# Patient Record
Sex: Male | Born: 1970 | Race: White | Hispanic: No | Marital: Single | Smoking: Never smoker
Health system: Southern US, Community
[De-identification: ages and names within clinical notes are randomized; demographics above are authoritative.]

## PROBLEM LIST (undated history)

## (undated) DIAGNOSIS — Z8249 Family history of ischemic heart disease and other diseases of the circulatory system: Secondary | ICD-10-CM

## (undated) DIAGNOSIS — Z8042 Family history of malignant neoplasm of prostate: Secondary | ICD-10-CM

## (undated) DIAGNOSIS — J869 Pyothorax without fistula: Secondary | ICD-10-CM

## (undated) DIAGNOSIS — E785 Hyperlipidemia, unspecified: Secondary | ICD-10-CM

## (undated) DIAGNOSIS — E119 Type 2 diabetes mellitus without complications: Secondary | ICD-10-CM

## (undated) HISTORY — PX: OTHER SURGICAL HISTORY: SHX169

## (undated) HISTORY — DX: Family history of malignant neoplasm of prostate: Z80.42

## (undated) HISTORY — DX: Family history of ischemic heart disease and other diseases of the circulatory system: Z82.49

## (undated) HISTORY — DX: Pyothorax without fistula: J86.9

## (undated) HISTORY — DX: Hyperlipidemia, unspecified: E78.5

---

## 2008-02-18 ENCOUNTER — Ambulatory Visit: Payer: Self-pay | Admitting: Family Medicine

## 2008-03-06 ENCOUNTER — Ambulatory Visit: Payer: Self-pay | Admitting: Family Medicine

## 2008-03-13 ENCOUNTER — Ambulatory Visit: Payer: Self-pay | Admitting: Family Medicine

## 2008-03-27 ENCOUNTER — Ambulatory Visit: Payer: Self-pay | Admitting: Family Medicine

## 2008-04-24 ENCOUNTER — Ambulatory Visit: Payer: Self-pay | Admitting: Family Medicine

## 2014-02-12 DIAGNOSIS — J869 Pyothorax without fistula: Secondary | ICD-10-CM

## 2014-02-12 HISTORY — DX: Pyothorax without fistula: J86.9

## 2014-03-08 ENCOUNTER — Other Ambulatory Visit: Payer: Self-pay | Admitting: Medical

## 2014-03-08 ENCOUNTER — Ambulatory Visit (INDEPENDENT_AMBULATORY_CARE_PROVIDER_SITE_OTHER): Payer: 59 | Admitting: Medical

## 2014-03-08 ENCOUNTER — Ambulatory Visit
Admission: RE | Admit: 2014-03-08 | Discharge: 2014-03-08 | Disposition: A | Discharge status: Home / Self Care | Payer: 59 | Source: Ambulatory Visit | Attending: Medical | Admitting: Medical

## 2014-03-08 ENCOUNTER — Encounter: Payer: Self-pay | Admitting: Medical

## 2014-03-08 ENCOUNTER — Other Ambulatory Visit: Payer: Self-pay | Admitting: Family Medicine

## 2014-03-08 VITALS — BP 142/80 | HR 100 | Temp 98.0°F | Resp 16 | Ht 65.0 in | Wt 234.0 lb

## 2014-03-08 DIAGNOSIS — R000 Tachycardia, unspecified: Secondary | ICD-10-CM

## 2014-03-08 DIAGNOSIS — R05 Cough: Secondary | ICD-10-CM

## 2014-03-08 DIAGNOSIS — R202 Paresthesia of skin: Secondary | ICD-10-CM

## 2014-03-08 DIAGNOSIS — M549 Dorsalgia, unspecified: Secondary | ICD-10-CM

## 2014-03-08 DIAGNOSIS — G5620 Lesion of ulnar nerve, unspecified upper limb: Secondary | ICD-10-CM

## 2014-03-08 DIAGNOSIS — M62830 Muscle spasm of back: Secondary | ICD-10-CM

## 2014-03-08 DIAGNOSIS — M5380 Other specified dorsopathies, site unspecified: Secondary | ICD-10-CM

## 2014-03-08 DIAGNOSIS — R059 Cough, unspecified: Secondary | ICD-10-CM

## 2014-03-08 DIAGNOSIS — R0602 Shortness of breath: Secondary | ICD-10-CM

## 2014-03-08 DIAGNOSIS — R209 Unspecified disturbances of skin sensation: Secondary | ICD-10-CM

## 2014-03-08 LAB — BASIC METABOLIC PANEL
BUN: 11 mg/dL (ref 6–23)
CO2: 19 mEq/L (ref 19–32)
Calcium: 9.6 mg/dL (ref 8.4–10.5)
Chloride: 100 mEq/L (ref 96–112)
Creat: 0.68 mg/dL (ref 0.50–1.35)
GLUCOSE: 288 mg/dL — AB (ref 70–99)
Potassium: 3.9 mEq/L (ref 3.5–5.3)
Sodium: 136 mEq/L (ref 135–145)

## 2014-03-08 LAB — CBC
HCT: 43.1 % (ref 39.0–52.0)
Hemoglobin: 14.8 g/dL (ref 13.0–17.0)
MCH: 29.4 pg (ref 26.0–34.0)
MCHC: 34.3 g/dL (ref 30.0–36.0)
MCV: 85.7 fL (ref 78.0–100.0)
Platelets: 329 10*3/uL (ref 150–400)
RBC: 5.03 MIL/uL (ref 4.22–5.81)
RDW: 13 % (ref 11.5–15.5)
WBC: 20.2 10*3/uL — ABNORMAL HIGH (ref 4.0–10.5)

## 2014-03-08 LAB — D-DIMER, QUANTITATIVE (NOT AT ARMC): D DIMER QUANT: 0.37 ug{FEU}/mL (ref 0.00–0.48)

## 2014-03-08 MED ORDER — LEVOFLOXACIN 500 MG PO TABS: 500.0000 mg | ORAL_TABLET | Freq: Every day | ORAL | Status: DC

## 2014-03-08 NOTE — Addendum Note (Signed)
Addended by: Armanda Magic on: 03/08/2014 12:04 PM   Modules accepted: Orders

## 2014-03-08 NOTE — Progress Notes (Signed)
Subjective:   Dean Wallace is a 43 y.o. male presenting on 03/08/2014 with Cough and Back Pain  New returning patient.  Last visit 2009 here.   He notes cough x 2 weeks, though it was seasonal allergie, started back Claritin daily.  Feels like mucous needs to come up that won't .  2 days ago awoke with back pain, collapsed on the floor with severe back spasm, mid to upper back when he tried to get out of bed.  Laid on the floor 1/2 hour, then finally was able to get up in a chair.  After an hour still had back soreness, but was mobile at that time.  At the same time the back pain/spasm started, felt numbness of right 4th and 5th finger, left 5th finger.  The numbness has continued to now.   Still having back pain, hurts worse with lying, better with sitting.  No prior similar, but has general back pain in the past.  Denies having any back problems the night he went to bed.  Denies any recent strenuous activity, fall, injury, or trauma.    Since the back spasm, has had trouble getting good breaths.  Denies DOE, just can't seem to get good breath.   No sick contacts.  Using ibuprofen.  Nonsmoker.  No recent long travel, surgery, procedure, no hx/o DVT/PE, no calve pain.  He does use arm rest on chairs regularly.  No other aggravating or relieving factors.  No other complaint.  Review of Systems Constitutional: +subjective fever, -chills, -sweats, -unexpected weight change,+fatigue, +generally weak ENT: -runny nose, -ear pain, -sore throat Cardiology:  -chest pain, -palpitations, -edema, no hemoptysis Respiratory: +wheezing Gastroenterology: + mild abdominal pain with cough, -nausea, -vomiting, -diarrhea, -constipation  Hematology: -bleeding or bruising problems Musculoskeletal: -arthralgias, -myalgias, -joint swelling Ophthalmology: -vision changes Urology: -dysuria, -difficulty urinating, -hematuria, -urinary frequency, -urgency Neurology: -headache         Objective:    Filed  Vitals:   03/08/14 0912  BP: 142/80  Pulse: 100  Temp: 98 F (36.7 C)  Resp: 16    General appearance: alert, no distress, WD/WN, obese white male, coughing at times, at times slightly dyspneic HEENT: normocephalic, sclerae anicteric, TMs pearly, nares patent, no discharge or erythema, pharynx normal Oral cavity: somewhat dry, no lesions Neck: supple, no lymphadenopathy, no thyromegaly, no masses, no JVD Heart: RRR, normal S1, S2, no murmurs Lungs: right lower fields with some dullness to percussion, reduced breath sounds right lower fields, otherwise CTA bilaterally, no wheezes, rhonchi, or rales Abdomen: +bs, soft, non tender, non distended, no masses, no hepatomegaly, no splenomegaly Back: nontender, normal ROM MSK: UE  Nontender, no deformity, normal ROM Pulses: 2+ symmetric, upper and lower extremities, normal cap refill Ext: no edema, -homans,no calve tenderness or swelling Neuro: normal finger and hand sensation, strength      Assessment: Encounter Diagnoses  Name Primary?  . Cough Yes  . Upper back pain   . Back spasm   . Paresthesias   . Ulnar nerve compression   . Tachycardia      Plan: Somewhat unusual presentation, but given abnormal lung fields, severe back pain onset, and collection of symptoms, labs and xrays today to further eval.  We will call back today with next steps.    Dean Wallace was seen today for cough and back pain.  Diagnoses and associated orders for this visit:  Cough - D-dimer, quantitative - CBC - Basic metabolic panel - DG Chest 2 View; Future - DG  Thoracic Spine 2 View; Future  Upper back pain - D-dimer, quantitative - CBC - Basic metabolic panel - DG Chest 2 View; Future - DG Thoracic Spine 2 View; Future  Back spasm - D-dimer, quantitative - CBC - Basic metabolic panel - DG Chest 2 View; Future - DG Thoracic Spine 2 View; Future  Paresthesias - D-dimer, quantitative - CBC - Basic metabolic panel - DG Chest 2 View;  Future - DG Thoracic Spine 2 View; Future  Ulnar nerve compression - D-dimer, quantitative - CBC - Basic metabolic panel - DG Chest 2 View; Future - DG Thoracic Spine 2 View; Future  Tachycardia - D-dimer, quantitative - CBC - Basic metabolic panel - DG Chest 2 View; Future - DG Thoracic Spine 2 View; Future     Return pending studies.

## 2014-03-09 ENCOUNTER — Other Ambulatory Visit: Payer: Self-pay | Admitting: Medical

## 2014-03-09 ENCOUNTER — Ambulatory Visit (HOSPITAL_COMMUNITY)
Admission: RE | Admit: 2014-03-09 | Discharge: 2014-03-09 | Disposition: A | Discharge status: Home / Self Care | Payer: 59 | Source: Ambulatory Visit | Attending: Radiology | Admitting: Radiology

## 2014-03-09 ENCOUNTER — Telehealth: Payer: Self-pay

## 2014-03-09 ENCOUNTER — Ambulatory Visit (HOSPITAL_COMMUNITY)
Admission: RE | Admit: 2014-03-09 | Discharge: 2014-03-09 | Disposition: A | Discharge status: Home / Self Care | Payer: 59 | Source: Ambulatory Visit | Attending: Medical | Admitting: Medical

## 2014-03-09 VITALS — BP 136/88

## 2014-03-09 DIAGNOSIS — J90 Pleural effusion, not elsewhere classified: Secondary | ICD-10-CM | POA: Insufficient documentation

## 2014-03-09 DIAGNOSIS — M549 Dorsalgia, unspecified: Secondary | ICD-10-CM

## 2014-03-09 DIAGNOSIS — D72829 Elevated white blood cell count, unspecified: Secondary | ICD-10-CM

## 2014-03-09 DIAGNOSIS — R05 Cough: Secondary | ICD-10-CM

## 2014-03-09 DIAGNOSIS — R059 Cough, unspecified: Secondary | ICD-10-CM | POA: Insufficient documentation

## 2014-03-09 DIAGNOSIS — J189 Pneumonia, unspecified organism: Secondary | ICD-10-CM | POA: Insufficient documentation

## 2014-03-09 LAB — BODY FLUID CELL COUNT WITH DIFFERENTIAL
Lymphs, Fluid: 15 %
MONOCYTE-MACROPHAGE-SEROUS FLUID: 13 % — AB (ref 50–90)
Neutrophil Count, Fluid: 75 % — ABNORMAL HIGH (ref 0–25)
Total Nucleated Cell Count, Fluid: UNDETERMINED cu mm (ref 0–1000)

## 2014-03-09 NOTE — Procedures (Signed)
US guided diagnostic right thoracentesis performed yielding 140 cc's turbid, yellow fluid. The fluid was sent to the lab for preordered studies. The collection was multiloculated and only the above amount of fluid could be removed at this time. F/u CXR pending. Consider f/u CT chest for further evaluation.

## 2014-03-09 NOTE — Telephone Encounter (Signed)
Message copied by Louie Bun on Thu Mar 09, 2014 10:19 AM ------      Message from: Carlena Hurl      Created: Thu Mar 09, 2014  9:57 AM       Please call and let him know that I think we need to get a sample of that fluid this in his lungs.  This is called an aspiration and it is done with ultrasound guidance and a needle.   See if he can go today if we can work this out if not we will schedule for tomorrow.            Please call central scheduling at [phone].  Let them know that this guy (outpatient) has a pleural effusion, we would like it aspirated and sent for testing.  See what their protocol is, do we need to order this in Epic, do we need to order the labs or do they have a protocol?             ------

## 2014-03-10 ENCOUNTER — Encounter: Payer: Self-pay | Admitting: Medical

## 2014-03-10 ENCOUNTER — Encounter (HOSPITAL_COMMUNITY): Payer: Self-pay | Admitting: Cardiology

## 2014-03-10 ENCOUNTER — Ambulatory Visit (INDEPENDENT_AMBULATORY_CARE_PROVIDER_SITE_OTHER): Payer: 59 | Admitting: Medical

## 2014-03-10 ENCOUNTER — Ambulatory Visit (HOSPITAL_COMMUNITY)
Admission: RE | Admit: 2014-03-10 | Discharge: 2014-03-10 | Disposition: A | Discharge status: Home / Self Care | Payer: 59 | Source: Ambulatory Visit | Attending: Medical | Admitting: Medical

## 2014-03-10 ENCOUNTER — Inpatient Hospital Stay (HOSPITAL_COMMUNITY)
Admission: AD | Admit: 2014-03-10 | Discharge: 2014-03-17 | DRG: 853 | Disposition: A | Discharge status: Home / Self Care | Payer: 59 | Source: Ambulatory Visit | Attending: Internal Medicine | Admitting: Internal Medicine

## 2014-03-10 ENCOUNTER — Telehealth: Payer: Self-pay | Admitting: Family Medicine

## 2014-03-10 VITALS — BP 130/80 | HR 126 | Temp 98.4°F | Resp 22 | Wt 221.0 lb

## 2014-03-10 DIAGNOSIS — R0602 Shortness of breath: Secondary | ICD-10-CM

## 2014-03-10 DIAGNOSIS — I498 Other specified cardiac arrhythmias: Secondary | ICD-10-CM | POA: Diagnosis present

## 2014-03-10 DIAGNOSIS — I10 Essential (primary) hypertension: Secondary | ICD-10-CM | POA: Diagnosis present

## 2014-03-10 DIAGNOSIS — D72829 Elevated white blood cell count, unspecified: Secondary | ICD-10-CM

## 2014-03-10 DIAGNOSIS — R05 Cough: Secondary | ICD-10-CM

## 2014-03-10 DIAGNOSIS — J9819 Other pulmonary collapse: Secondary | ICD-10-CM | POA: Diagnosis not present

## 2014-03-10 DIAGNOSIS — J869 Pyothorax without fistula: Secondary | ICD-10-CM | POA: Diagnosis present

## 2014-03-10 DIAGNOSIS — E1165 Type 2 diabetes mellitus with hyperglycemia: Secondary | ICD-10-CM

## 2014-03-10 DIAGNOSIS — R0902 Hypoxemia: Secondary | ICD-10-CM

## 2014-03-10 DIAGNOSIS — IMO0001 Reserved for inherently not codable concepts without codable children: Secondary | ICD-10-CM | POA: Diagnosis present

## 2014-03-10 DIAGNOSIS — R9389 Abnormal findings on diagnostic imaging of other specified body structures: Secondary | ICD-10-CM

## 2014-03-10 DIAGNOSIS — J189 Pneumonia, unspecified organism: Secondary | ICD-10-CM | POA: Diagnosis present

## 2014-03-10 DIAGNOSIS — A419 Sepsis, unspecified organism: Secondary | ICD-10-CM

## 2014-03-10 DIAGNOSIS — D62 Acute posthemorrhagic anemia: Secondary | ICD-10-CM | POA: Diagnosis not present

## 2014-03-10 DIAGNOSIS — E119 Type 2 diabetes mellitus without complications: Secondary | ICD-10-CM

## 2014-03-10 DIAGNOSIS — E876 Hypokalemia: Secondary | ICD-10-CM | POA: Diagnosis not present

## 2014-03-10 DIAGNOSIS — M549 Dorsalgia, unspecified: Secondary | ICD-10-CM

## 2014-03-10 DIAGNOSIS — Z794 Long term (current) use of insulin: Secondary | ICD-10-CM

## 2014-03-10 DIAGNOSIS — R059 Cough, unspecified: Secondary | ICD-10-CM

## 2014-03-10 DIAGNOSIS — Z8249 Family history of ischemic heart disease and other diseases of the circulatory system: Secondary | ICD-10-CM

## 2014-03-10 DIAGNOSIS — J90 Pleural effusion, not elsewhere classified: Secondary | ICD-10-CM

## 2014-03-10 HISTORY — DX: Type 2 diabetes mellitus without complications: E11.9

## 2014-03-10 LAB — CBC WITH DIFFERENTIAL/PLATELET
Basophils Absolute: 0 10*3/uL (ref 0.0–0.1)
Basophils Relative: 0 % (ref 0–1)
EOS PCT: 0 % (ref 0–5)
Eosinophils Absolute: 0 10*3/uL (ref 0.0–0.7)
HEMATOCRIT: 40.6 % (ref 39.0–52.0)
HEMOGLOBIN: 14.4 g/dL (ref 13.0–17.0)
LYMPHS ABS: 2.4 10*3/uL (ref 0.7–4.0)
Lymphocytes Relative: 12 % (ref 12–46)
MCH: 30.5 pg (ref 26.0–34.0)
MCHC: 35.5 g/dL (ref 30.0–36.0)
MCV: 86 fL (ref 78.0–100.0)
MONOS PCT: 4 % (ref 3–12)
Monocytes Absolute: 0.9 10*3/uL (ref 0.1–1.0)
Neutro Abs: 16.7 10*3/uL — ABNORMAL HIGH (ref 1.7–7.7)
Neutrophils Relative %: 83 % — ABNORMAL HIGH (ref 43–77)
Platelets: 336 10*3/uL (ref 150–400)
RBC: 4.72 MIL/uL (ref 4.22–5.81)
RDW: 12.7 % (ref 11.5–15.5)
WBC: 20 10*3/uL — AB (ref 4.0–10.5)

## 2014-03-10 LAB — COMPREHENSIVE METABOLIC PANEL
ALT: 22 U/L (ref 0–53)
AST: 13 U/L (ref 0–37)
Albumin: 2.5 g/dL — ABNORMAL LOW (ref 3.5–5.2)
Alkaline Phosphatase: 81 U/L (ref 39–117)
BILIRUBIN TOTAL: 0.5 mg/dL (ref 0.3–1.2)
BUN: 9 mg/dL (ref 6–23)
CALCIUM: 9.2 mg/dL (ref 8.4–10.5)
CHLORIDE: 96 meq/L (ref 96–112)
CO2: 14 meq/L — AB (ref 19–32)
Creatinine, Ser: 0.59 mg/dL (ref 0.50–1.35)
GFR calc non Af Amer: >90 mL/min (ref >90)
GLUCOSE: 322 mg/dL — AB (ref 70–99)
Potassium: 3.9 mEq/L (ref 3.7–5.3)
Sodium: 132 mEq/L — ABNORMAL LOW (ref 137–147)
Total Protein: 6.9 g/dL (ref 6.0–8.3)

## 2014-03-10 LAB — PH, BODY FLUID: PH, FLUID: 6.5

## 2014-03-10 LAB — GLUCOSE, SEROUS FLUID: Glucose, Fluid: 103 mg/dL

## 2014-03-10 LAB — PROTEIN, BODY FLUID: Total protein, fluid: 4.9 g/dL

## 2014-03-10 LAB — PROTIME-INR
INR: 1.19 (ref 0.00–1.49)
Prothrombin Time: 14.8 seconds (ref 11.6–15.2)

## 2014-03-10 LAB — LACTATE DEHYDROGENASE, PLEURAL OR PERITONEAL FLUID: LD, Fluid: 2080 U/L — ABNORMAL HIGH (ref 3–23)

## 2014-03-10 LAB — APTT: aPTT: 35 seconds (ref 24–37)

## 2014-03-10 MED ORDER — ENOXAPARIN SODIUM 40 MG/0.4ML ~~LOC~~ SOLN
40.0000 mg | SUBCUTANEOUS | Status: DC
  Administered 2014-03-11 (×2): 40 mg via SUBCUTANEOUS
  Filled 2014-03-10 (×3): qty 0.4

## 2014-03-10 MED ORDER — VANCOMYCIN HCL IN DEXTROSE 1-5 GM/200ML-% IV SOLN
1000.0000 mg | Freq: Three times a day (TID) | INTRAVENOUS | Status: DC
  Administered 2014-03-10 – 2014-03-13 (×8): 1000 mg via INTRAVENOUS
  Filled 2014-03-10 (×11): qty 200

## 2014-03-10 MED ORDER — ONDANSETRON HCL 4 MG PO TABS: 4.0000 mg | ORAL_TABLET | Freq: Four times a day (QID) | ORAL | Status: DC | PRN

## 2014-03-10 MED ORDER — IOHEXOL 350 MG/ML SOLN
80.0000 mL | Freq: Once | INTRAVENOUS | Status: AC | PRN
Start: 2014-03-10 — End: 2014-03-10
  Administered 2014-03-10: 80 mL via INTRAVENOUS

## 2014-03-10 MED ORDER — PIPERACILLIN-TAZOBACTAM 3.375 G IVPB
3.3750 g | Freq: Three times a day (TID) | INTRAVENOUS | Status: DC
  Administered 2014-03-10 – 2014-03-16 (×17): 3.375 g via INTRAVENOUS
  Filled 2014-03-10 (×22): qty 50

## 2014-03-10 MED ORDER — SODIUM CHLORIDE 0.9 % IV SOLN
INTRAVENOUS | Status: DC
  Administered 2014-03-10: 19:00:00 via INTRAVENOUS

## 2014-03-10 MED ORDER — MORPHINE SULFATE 2 MG/ML IJ SOLN
2.0000 mg | INTRAMUSCULAR | Status: DC | PRN
  Administered 2014-03-10 – 2014-03-12 (×6): 2 mg via INTRAVENOUS
  Filled 2014-03-10 (×6): qty 1

## 2014-03-10 MED ORDER — ONDANSETRON HCL 4 MG/2ML IJ SOLN: 4.0000 mg | Freq: Four times a day (QID) | INTRAMUSCULAR | Status: DC | PRN

## 2014-03-10 NOTE — Telephone Encounter (Signed)
CHART OPEN BY MISTAKE

## 2014-03-10 NOTE — H&P (Signed)
PCP:   Crisoforo Oxford, PA-C   Chief Complaint:  Cough  HPI: 43 year old male with a history of diet-controlled diabetes mellitus who was seen at the primary care office on 3/25 for cough which has been going for 2 weeks, did not improve with Claritin. Patient on that the complained of severe back muscle spasm, mid  to upper back when he tried to get out of bed. Patient was sent for imaging studies including d-dimer, chest x-ray, thoracic spine x-ray. Chest x-ray showed collapse/consolidation of the right lower lobe and right middle lobe most commonly associated with multifocal pneumonia, CT chest was recommended, patient was called for thoracentesis under ultrasound guidance, and underwent the procedure on 3/26, pleural fluid analysis showed exudative effusion. Patient underwent CT chest on 3/27 to rule out pulmonary embolism, which showed large loculated right pleural effusion worrisome for empyema with compressive atelectasis in the right lower lobe. CT surgery was consulted by the primary care physician, and they recommended VATS versus chest tube insertion. Patient denies fever, no masses, no nausea vomiting or diarrhea. No dysuria urgency or frequency of urination. Patient has history of diabetes mellitus which is diet-controlled, and as per patient the blood sugar has been not well managed with diet only. Patient sent to hospital for admission.   Allergies:  No Known Allergies   No past medical history on file.  No past surgical history on file.  Prior to Admission medications   Medication Sig Start Date End Date Taking? Authorizing Provider  guaiFENesin (MUCINEX) 600 MG 12 hr tablet Take 1,200 mg by mouth 2 (two) times daily as needed for cough.   Yes Historical Provider, MD  ibuprofen (ADVIL,MOTRIN) 200 MG tablet Take 600 mg by mouth every 8 (eight) hours as needed (back pain).   Yes Historical Provider, MD  levofloxacin (LEVAQUIN) 500 MG tablet Take 500 mg by mouth daily. For 7  days 03/08/14  Yes Historical Provider, MD    Social History:  reports that he has never smoked. He does not have any smokeless tobacco history on file. His alcohol and drug histories are not on file.    All the positives are listed in BOLD  Review of Systems:  HEENT: Headache, blurred vision, runny nose, sore throat Neck: Hypothyroidism, hyperthyroidism,,lymphadenopathy Chest : Shortness of breath, history of COPD, Asthma, cough Heart : Chest pain, history of coronary arterey disease GI:  Nausea, vomiting, diarrhea, constipation, GERD GU: Dysuria, urgency, frequency of urination, hematuria Neuro: Stroke, seizures, syncope Psych: Depression, anxiety, hallucinations   Physical Exam: Blood pressure 155/93, pulse 132, temperature 98.2 F (36.8 C), temperature source Oral, height 5' 5"  (1.651 m), weight 99.4 kg (219 lb 2.2 oz), SpO2 96.00%. Constitutional:   Patient is a well-developed and well-nourished *male in no acute distress and cooperative with exam. Head: Normocephalic and atraumatic Mouth: Mucus membranes moist Eyes: PERRL, EOMI, conjunctivae normal Neck: Supple, No Thyromegaly Cardiovascular: RRR, S1 normal, S2 normal Pulmonary/Chest: Bronchial breathing in the right lower lobe Abdominal: Soft. Non-tender, non-distended, bowel sounds are normal, no masses, organomegaly, or guarding present.  Neurological: A&O x3, Strenght is normal and symmetric bilaterally, cranial nerve II-XII are grossly intact, no focal motor deficit, sensory intact to light touch bilaterally.  Extremities : No Cyanosis, Clubbing or Edema   Labs on Admission:  Results for orders placed during the hospital encounter of 03/09/14 (from the past 48 hour(s))  PROTEIN, BODY FLUID     Status: None   Collection Time    03/09/14  2:24 PM  Result Value Ref Range   Total protein, fluid 4.9     Comment: NO NORMAL RANGE ESTABLISHED FOR THIS TEST     Performed at [facility]   Fluid Type-FTP  PLEURAL    PH, BODY FLUID     Status: None   Collection Time    03/09/14  2:24 PM      Result Value Ref Range   pH, Fluid Type PLEURAL     pH, Fluid 6.50     Comment: Performed at [place], BODY FLUID     Status: Abnormal   Collection Time    03/09/14  2:24 PM      Result Value Ref Range   LD, Fluid 2080 (*) 3 - 23 U/L   Comment: RESULT CONFIRMED BY AUTOMATED DILUTION     Performed at [facility]   Fluid Type-FLDH PLEURAL    GLUCOSE, SEROUS FLUID     Status: None   Collection Time    03/09/14  2:24 PM      Result Value Ref Range   Glucose, Fluid 103     Comment:            FLUID GLUCOSE LEVELS OF <60     mg/dL OR VALUES OF 40 mg/dL     LESS THAN A SIMULTANEOUS     SERUM LEVEL ARE CONSIDERED     DECREASED.     Performed at [facility]   Fluid Type-FGLU PLEURAL    BODY FLUID CULTURE     Status: None   Collection Time    03/09/14  2:24 PM      Result Value Ref Range   Specimen Description PLEURAL     Special Requests NONE     Gram Stain       Value: FEW WBC PRESENT,BOTH PMN AND MONONUCLEAR     NO ORGANISMS SEEN     Performed at [facility]   Culture       Value: NO GROWTH 1 DAY     Performed at [place] PENDING    BODY FLUID CELL COUNT WITH DIFFERENTIAL     Status: Abnormal   Collection Time    03/09/14  2:24 PM      Result Value Ref Range   Fluid Type-FCT PLEURAL     Color, Fluid YELLOW  YELLOW   Appearance, Fluid TURBID (*) CLEAR   WBC, Fluid UNABLE TO PERFORM COUNT DUE TO CLOT IN SPECIMEN  0 - 1000 cu mm   Neutrophil Count, Fluid 75 (*) 0 - 25 %   Lymphs, Fluid 15     Monocyte-Macrophage-Serous Fluid 13 (*) 50 - 90 %   Other Cells, Fluid INTRACELLULAR AND EXTRACELLULAR BACTERIA     Comment: CORRELATE WITH CYTOLOGY MICROBIOLOGY    Radiological Exams on Admission: Dg Chest 1 View  03/09/2014   CLINICAL DATA:  Status post right-sided thoracentesis  EXAM: CHEST - 1 VIEW   COMPARISON:  PA and lateral chest x-ray dated March 08, 2014  FINDINGS: There remains volume loss on the right little changed from yesterday's study. There is no evidence of a pneumothorax. The left lung is adequately inflated and clear. The cardiac silhouette where visualized appears mildly enlarged. The pulmonary vascularity on the left is normal. The is obscured on the right.  IMPRESSION: There is no evidence of a postprocedure pneumothorax following the right thoracentesis. The degree of volume loss on the right  is little changed since yesterday's study.   Electronically Signed   By: David  Martinique   On: 03/09/2014 14:24   Ct Angio Chest Pe W/cm &/or Wo Cm  03/10/2014   CLINICAL DATA:  Short of breath and cough.  Recent pneumonia  EXAM: CT ANGIOGRAPHY CHEST WITH CONTRAST  TECHNIQUE: Multidetector CT imaging of the chest was performed using the standard protocol during bolus administration of intravenous contrast. Multiplanar CT image reconstructions and MIPs were obtained to evaluate the vascular anatomy.  CONTRAST:  56m OMNIPAQUE IOHEXOL 350 MG/ML SOLN  COMPARISON:  CXR 03/09/2014  FINDINGS: Negative for pulmonary embolism.  Negative for aortic dissection or aneurysm. Heart size is normal. Mild coronary artery calcification noted. Negative for pericardial effusion.  Large loculated right pleural effusion, suspicious for empyema. Compressive atelectasis in the right lower lobe.  Negative for infiltrate or effusion in the left chest.  Fatty infiltration of the liver with no focal liver lesion identified. Spleen is normal in size.  Review of the MIP images confirms the above findings.  IMPRESSION: Negative for pulmonary embolism.  Large loculated right pleural effusion, worrisome for empyema. There is compressive atelectasis in the right lower lobe.  Fatty liver   Electronically Signed   By: CFranchot GalloM.D.   On: 03/10/2014 12:38   UKoreaThoracentesis Asp Pleural Space W/img Guide  03/09/2014   CLINICAL  DATA:  Pneumonia, cough, right pleural effusion. Request is made for diagnostic/therapeutic right thoracentesis  EXAM: ULTRASOUND GUIDED DIAGNOSTIC RIGHT THORACENTESIS  COMPARISON:  None.  PROCEDURE: An ultrasound guided thoracentesis was thoroughly discussed with the patient and questions answered. The benefits, risks, alternatives and complications were also discussed. The patient understands and wishes to proceed with the procedure. Written consent was obtained.  Ultrasound was performed to localize and mark an adequate pocket of fluid in the right chest. The area was then prepped and draped in the normal sterile fashion. 1% Lidocaine was used for local anesthesia. Under ultrasound guidance a 19 gauge Yueh catheter was introduced. Thoracentesis was performed. The catheter was removed and a dressing applied.  Complications:  None.  FINDINGS: A total of approximately 140 cc's of turbid, yellow fluid was removed. The fluid sample wassent for laboratory analysis. The pleural fluid collection was multiloculated in nature. Only the above amount of fluid could be removed at this time.  IMPRESSION: Successful ultrasound guided diagnostic right thoracentesis yielding 140 cc's of pleural fluid. Pleural fluid collection appears multiloculated on ultrasound imaging. Recommend follow-up CT chest for further evaluation.  Read by: KRowe Robert,P.A.-C.   Electronically Signed   By: TDaryll BrodM.D.   On: 03/09/2014 14:43    Assessment/Plan Active Problems:   Empyema of right pleural space   DM (diabetes mellitus)  Empyema of the right pleural space Called for consult for CT surgery, Dr HRoxan Hockeyfor possible chest tube versus VATS To start him on antibiotics vancomycin and Zosyn. Follow the cultures from the pleural fluid  Diabetes mellitus We'll start the patient on sliding-scale insulin, obtain hemoglobin A1c We'll start Cardura for blood and keep him n.p.o. after midnight.  DVT  prophylaxis Lovenox  Code status: Full code  Family discussion: Discussed with patient's family at bedside   Time Spent on Admission: 570minutes  [facility] Pager: [phone]/27/2015, 6:13 PM  If 7PM-7AM, please contact night-coverage  [email or web address]  Password [email or web address]

## 2014-03-10 NOTE — Progress Notes (Signed)
Subjective:   Dean Wallace is a 43 y.o. male presenting on 03/10/2014 with Follow-up  I saw him 2 days ago as a new /returning patient.  Last visit 2009 here.  He apparently has a hx/o "diet controlled" diabetes that he didn't mention 2 days ago.  He notes cough x 2 weeks, though it was seasonal allergie, started back Claritin daily.  Feels like mucous needs to come up that won't .  2 days ago awoke with back pain, collapsed on the floor with severe back spasm, mid to upper back when he tried to get out of bed.  Laid on the floor 1/2 hour, then finally was able to get up in a chair.  After an hour still had back soreness, but was mobile at that time.  At the same time the back pain/spasm started, felt numbness of right 4th and 5th finger, left 5th finger.  The numbness has continued to now.   Still having back pain, hurts worse with lying, better with sitting.  No prior similar, but has general back pain in the past.  Denies having any back problems the night he went to bed.  Denies any recent strenuous activity, fall, injury, or trauma.    Since the back spasm, has had trouble getting good breaths.  Denies DOE, just can't seem to get good breath.   No sick contacts.  Using ibuprofen.  Nonsmoker.  No recent long travel, surgery, procedure, no hx/o DVT/PE, no calve pain.  He does use arm rest on chairs regularly.  No other aggravating or relieving factors.  No other complaint.  Review of Systems Constitutional: +subjective fever, -chills, -sweats, -unexpected weight change,+fatigue, +generally weak ENT: -runny nose, -ear pain, -sore throat Cardiology:  -chest pain, -palpitations, -edema, no hemoptysis Respiratory: +wheezing Gastroenterology: + mild abdominal pain with cough, -nausea, -vomiting, -diarrhea, -constipation  Hematology: -bleeding or bruising problems Musculoskeletal: -arthralgias, -myalgias, -joint swelling Ophthalmology: -vision changes Urology: -dysuria, -difficulty urinating,  -hematuria, -urinary frequency, -urgency Neurology: -headache         Objective:    Filed Vitals:   03/10/14 1412  BP: 130/80  Pulse: 126  Temp: 98.4 F (36.9 C)  Resp: 22    General appearance: alert, no distress, WD/WN, obese white male, at times slightly dyspneic HEENT: normocephalic, sclerae anicteric, TMs pearly, nares patent, no discharge or erythema, pharynx normal Oral cavity: somewhat dry, no lesions Neck: supple, no lymphadenopathy, no thyromegaly, no masses, no JVD Heart: RRR, normal S1, S2, no murmurs Lungs: right lung with dullness to percussion, reduced breath sounds right fields, otherwise CTA bilaterally, no wheezes, rhonchi, or rales Abdomen: +bs, soft, non tender, non distended, no masses, no hepatomegaly, no splenomegaly Back: nontender, normal ROM MSK: UE  Nontender, no deformity, normal ROM Pulses: 2+ symmetric, upper and lower extremities, normal cap refill Ext: no edema, -homans,no calve tenderness or swelling       Assessment: Encounter Diagnoses  Name Primary?  . Cough Yes  . Leukocytosis, unspecified   . Hypoxemia   . SOB (shortness of breath)   . Empyema lung   . Abnormal chest CT   . Back pain   . Type II or unspecified type diabetes mellitus without mention of complication, uncontrolled      Plan: Currently he is afebrile, however he is taking ibuprofen throughout the day, his pulse ox is 91% on room air, he is tachycardic and tachypneic.  After his last visit 2 days ago, his white count was elevated over 20,000, we  sent him for thoracentesis yesterday and 140 cc of pleural fluid was drained, pleural fluid sent for analysis suggesting infection/empyema so far, CT this mornign shows what appears to be empyema.  I spoke with pulmonologist Dr. Baird Lyons today who advised that I speak to cardiothoracic surgery. I spoke to Dr. Roxan Hockey with cardiothoracic surgery who advised that he would likely need either a chest tube or VATS  surgery for this.  I called the hospitalist at [facility] to discuss the case.  He will be admitted to med tele floor for empyema with Dr. Tana Coast.  We called to get this lined up.    Of note, no routine care for diabetes in a few years.   His recent glucose was 288 two days ago.    Dean Wallace was seen today for follow-up.  Diagnoses and associated orders for this visit:  Cough  Leukocytosis, unspecified  Hypoxemia  SOB (shortness of breath)  Empyema lung  Abnormal chest CT  Back pain  Type II or unspecified type diabetes mellitus without mention of complication, uncontrolled    Return report to [facility].

## 2014-03-10 NOTE — Progress Notes (Signed)
ANTIBIOTIC CONSULT NOTE - INITIAL  Pharmacy Consult for Vancomycin, Zosyn Indication: Empyema  No Known Allergies  Patient Measurements: Height: 5' 5"  (165.1 cm) Weight: 219 lb 2.2 oz (99.4 kg) IBW/kg (Calculated) : 61.5   Vital Signs: Temp: 98.2 F (36.8 C) (03/27 1631) Temp src: Oral (03/27 1631) BP: 155/93 mmHg (03/27 1631) Pulse Rate: 132 (03/27 1631)  Labs:  Recent Labs  03/08/14 1206  WBC 20.2*  HGB 14.8  PLT 329  CREATININE 0.68      Microbiology: Recent Results (from the past 720 hour(s))  BODY FLUID CULTURE     Status: None   Collection Time    03/09/14  2:24 PM      Result Value Ref Range Status   Specimen Description PLEURAL   Final   Special Requests NONE   Final   Gram Stain     Final   Value: FEW WBC PRESENT,BOTH PMN AND MONONUCLEAR     NO ORGANISMS SEEN     Performed at [facility]   Culture     Final   Value: NO GROWTH 1 DAY     Performed at [facility]   Report Status PENDING   Incomplete    Medical History: No past medical history on file.  Assessment: 43 year old male admitted for evaluation of empyema of right pleural space.  Pharmacy consulted to begin broad spectrum antibiotics.  CVTS consulted for possible chest tube versus VATS  Renal function is stable  Goal of Therapy:  Vancomycin trough level 15-20 mcg/ml Appropriate Zosyn dosing  Plan:  1) Zosyn 3.375 grams iv Q 8 hours 2) Vancomycin 1 gram iv Q 8 hours 3) Follow up plan, renal function, fever trend 4) Vancomycin trough when indicated  Thank you. Anette Guarneri, PharmD [phone]  03/10/2014,6:39 PM

## 2014-03-11 ENCOUNTER — Inpatient Hospital Stay (HOSPITAL_COMMUNITY): Payer: 59

## 2014-03-11 DIAGNOSIS — J90 Pleural effusion, not elsewhere classified: Secondary | ICD-10-CM

## 2014-03-11 LAB — BLOOD GAS, ARTERIAL
ACID-BASE DEFICIT: 14.3 mmol/L — AB (ref 0.0–2.0)
Bicarbonate: 11.5 mEq/L — ABNORMAL LOW (ref 20.0–24.0)
DRAWN BY: 32526
FIO2: 0.21 %
O2 Saturation: 94.2 %
PATIENT TEMPERATURE: 98.6
PH ART: 7.258 — AB (ref 7.350–7.450)
TCO2: 12.3 mmol/L (ref 0–100)
pCO2 arterial: 26.5 mmHg — ABNORMAL LOW (ref 35.0–45.0)
pO2, Arterial: 75 mmHg — ABNORMAL LOW (ref 80.0–100.0)

## 2014-03-11 LAB — COMPREHENSIVE METABOLIC PANEL
ALK PHOS: 88 U/L (ref 39–117)
ALT: 22 U/L (ref 0–53)
ALT: 25 U/L (ref 0–53)
AST: 12 U/L (ref 0–37)
AST: 13 U/L (ref 0–37)
Albumin: 2.6 g/dL — ABNORMAL LOW (ref 3.5–5.2)
Albumin: 2.6 g/dL — ABNORMAL LOW (ref 3.5–5.2)
Alkaline Phosphatase: 96 U/L (ref 39–117)
BILIRUBIN TOTAL: 0.5 mg/dL (ref 0.3–1.2)
BUN: 9 mg/dL (ref 6–23)
BUN: 10 mg/dL (ref 6–23)
CHLORIDE: 94 meq/L — AB (ref 96–112)
CO2: 14 meq/L — AB (ref 19–32)
CO2: 13 mEq/L — ABNORMAL LOW (ref 19–32)
Calcium: 9.5 mg/dL (ref 8.4–10.5)
Calcium: 10 mg/dL (ref 8.4–10.5)
Chloride: 96 mEq/L (ref 96–112)
Creatinine, Ser: 0.69 mg/dL (ref 0.50–1.35)
Creatinine, Ser: 0.63 mg/dL (ref 0.50–1.35)
GFR calc Af Amer: >90 mL/min (ref >90)
GFR calc non Af Amer: >90 mL/min (ref >90)
GLUCOSE: 295 mg/dL — AB (ref 70–99)
Glucose, Bld: 348 mg/dL — ABNORMAL HIGH (ref 70–99)
POTASSIUM: 4.2 meq/L (ref 3.7–5.3)
Potassium: 4.2 mEq/L (ref 3.7–5.3)
SODIUM: 134 meq/L — AB (ref 137–147)
Sodium: 131 mEq/L — ABNORMAL LOW (ref 137–147)
Total Bilirubin: 0.5 mg/dL (ref 0.3–1.2)
Total Protein: 7.1 g/dL (ref 6.0–8.3)
Total Protein: 7.7 g/dL (ref 6.0–8.3)

## 2014-03-11 LAB — CBC
HCT: 43.2 % (ref 39.0–52.0)
HCT: 44.7 % (ref 39.0–52.0)
HEMOGLOBIN: 15.2 g/dL (ref 13.0–17.0)
HEMOGLOBIN: 15.7 g/dL (ref 13.0–17.0)
MCH: 30.7 pg (ref 26.0–34.0)
MCH: 30.7 pg (ref 26.0–34.0)
MCHC: 35.2 g/dL (ref 30.0–36.0)
MCHC: 35.1 g/dL (ref 30.0–36.0)
MCV: 87.3 fL (ref 78.0–100.0)
MCV: 87.3 fL (ref 78.0–100.0)
PLATELETS: 412 10*3/uL — AB (ref 150–400)
Platelets: 354 10*3/uL (ref 150–400)
RBC: 4.95 MIL/uL (ref 4.22–5.81)
RBC: 5.12 MIL/uL (ref 4.22–5.81)
RDW: 12.9 % (ref 11.5–15.5)
RDW: 12.8 % (ref 11.5–15.5)
WBC: 21.1 10*3/uL — ABNORMAL HIGH (ref 4.0–10.5)
WBC: 24.1 10*3/uL — ABNORMAL HIGH (ref 4.0–10.5)

## 2014-03-11 LAB — BASIC METABOLIC PANEL
BUN: 10 mg/dL (ref 6–23)
CHLORIDE: 95 meq/L — AB (ref 96–112)
CO2: 15 mEq/L — ABNORMAL LOW (ref 19–32)
CREATININE: 0.63 mg/dL (ref 0.50–1.35)
Calcium: 9.7 mg/dL (ref 8.4–10.5)
GFR calc Af Amer: >90 mL/min (ref >90)
GFR calc non Af Amer: >90 mL/min (ref >90)
Glucose, Bld: 350 mg/dL — ABNORMAL HIGH (ref 70–99)
POTASSIUM: 4.4 meq/L (ref 3.7–5.3)
Sodium: 131 mEq/L — ABNORMAL LOW (ref 137–147)

## 2014-03-11 LAB — URINALYSIS, ROUTINE W REFLEX MICROSCOPIC
Bilirubin Urine: NEGATIVE
Ketones, ur: >80 mg/dL — AB
Leukocytes, UA: NEGATIVE
Nitrite: NEGATIVE
PH: 5 (ref 5.0–8.0)
Protein, ur: 30 mg/dL — AB
Specific Gravity, Urine: 1.028 (ref 1.005–1.030)
Urobilinogen, UA: 0.2 mg/dL (ref 0.0–1.0)

## 2014-03-11 LAB — URINE MICROSCOPIC-ADD ON

## 2014-03-11 LAB — ABO/RH: ABO/RH(D): A POS

## 2014-03-11 LAB — GLUCOSE, CAPILLARY
GLUCOSE-CAPILLARY: 318 mg/dL — AB (ref 70–99)
Glucose-Capillary: 275 mg/dL — ABNORMAL HIGH (ref 70–99)
Glucose-Capillary: 293 mg/dL — ABNORMAL HIGH (ref 70–99)

## 2014-03-11 LAB — TYPE AND SCREEN
ABO/RH(D): A POS
Antibody Screen: NEGATIVE

## 2014-03-11 LAB — HEMOGLOBIN A1C
Hgb A1c MFr Bld: 12.4 % — ABNORMAL HIGH (ref <5.7)
MEAN PLASMA GLUCOSE: 309 mg/dL — AB (ref <117)

## 2014-03-11 LAB — APTT: aPTT: 29 seconds (ref 24–37)

## 2014-03-11 LAB — PROTIME-INR
INR: 1.23 (ref 0.00–1.49)
Prothrombin Time: 15.2 seconds (ref 11.6–15.2)

## 2014-03-11 MED ORDER — SODIUM BICARBONATE 8.4 % IV SOLN
INTRAVENOUS | Status: DC
  Administered 2014-03-11: 19:00:00 via INTRAVENOUS
  Filled 2014-03-11 (×2): qty 1000

## 2014-03-11 MED ORDER — INSULIN ASPART 100 UNIT/ML ~~LOC~~ SOLN
0.0000 [IU] | Freq: Three times a day (TID) | SUBCUTANEOUS | Status: DC
Start: 2014-03-12 — End: 2014-03-12

## 2014-03-11 MED ORDER — INSULIN ASPART 100 UNIT/ML ~~LOC~~ SOLN
0.0000 [IU] | Freq: Three times a day (TID) | SUBCUTANEOUS | Status: DC
  Administered 2014-03-11: 5 [IU] via SUBCUTANEOUS

## 2014-03-11 MED ORDER — INSULIN GLARGINE 100 UNIT/ML ~~LOC~~ SOLN
5.0000 [IU] | Freq: Once | SUBCUTANEOUS | Status: AC
  Administered 2014-03-11: 5 [IU] via SUBCUTANEOUS
  Filled 2014-03-11: qty 0.05

## 2014-03-11 MED ORDER — SODIUM CHLORIDE 0.9 % IV SOLN
INTRAVENOUS | Status: DC
  Filled 2014-03-11: qty 1000

## 2014-03-11 MED ORDER — INSULIN ASPART 100 UNIT/ML ~~LOC~~ SOLN
0.0000 [IU] | Freq: Every day | SUBCUTANEOUS | Status: DC
Start: 2014-03-11 — End: 2014-03-12
  Administered 2014-03-11: 21:00:00 via SUBCUTANEOUS

## 2014-03-11 NOTE — Consult Note (Signed)
Reason for Consult:Loculated right pleural effusion Referring Physician: Dr. Darrick Meigs Primary MD: Despina Pole [name] is an 43 y.o. male.  HPI: 43 yo male who presents with a cc/o cough, back pain and shortness of breath  Dean Wallace is 44 yo male life-long nonsmoker with a history of Type II diabetes. He was in his usual state of health until about 2 weeks PTA. He developed a cough which did not improve with OTC medications. It was nonproductive. He was not having any significant fever, chills or sweats. On Wednesday he developed abrupt onset of right sided back/ chest pain. He thought it was a muscle spasm. It was worsened with deep breaths and changes in position.   He went to his doctor's office. A CXR showed a right pleural effusion. Thoracentesis showed the effusion was exudative. A CT of the chest showed a multiloculated right effusion with atelectasis of the right lower and middle lobes. There was no PE.    Past Medical History  Diagnosis Date  . Diabetes mellitus without complication     Past Surgical History  Procedure Laterality Date  . Lung drained      History reviewed. No pertinent family history.  Social History:  reports that he has never smoked. He does not have any smokeless tobacco history on file. He reports that he does not drink alcohol or use illicit drugs.  Family History: + CAD  Allergies: No Known Allergies  Medications:  Scheduled: . enoxaparin (LOVENOX) injection  40 mg Subcutaneous Q24H  . insulin aspart  0-9 Units Subcutaneous TID WC  . piperacillin-tazobactam (ZOSYN)  IV  3.375 g Intravenous Q8H  . vancomycin  1,000 mg Intravenous Q8H    Results for orders placed during the hospital encounter of 03/10/14 (from the past 48 hour(s))  CULTURE, BLOOD (ROUTINE X 2)     Status: None   Collection Time    03/10/14  8:40 PM      Result Value Ref Range   Specimen Description BLOOD RIGHT ARM     Special Requests BOTTLES DRAWN AEROBIC ONLY 5CC     Culture  Setup Time       Value: 03/10/2014 23:55     Performed at [facility]   Culture       Value:        BLOOD CULTURE RECEIVED NO GROWTH TO DATE CULTURE WILL BE HELD FOR 5 DAYS BEFORE ISSUING A FINAL NEGATIVE REPORT     Performed at [facility]   Report Status PENDING    CULTURE, BLOOD (ROUTINE X 2)     Status: None   Collection Time    03/10/14  8:50 PM      Result Value Ref Range   Specimen Description BLOOD LEFT HAND     Special Requests BOTTLES DRAWN AEROBIC ONLY [place]     Culture  Setup Time       Value: 03/10/2014 23:56     Performed at [facility]   Culture       Value:        BLOOD CULTURE RECEIVED NO GROWTH TO DATE CULTURE WILL BE HELD FOR 5 DAYS BEFORE ISSUING A FINAL NEGATIVE REPORT     Performed at [facility]   Report Status PENDING    CBC WITH DIFFERENTIAL     Status: Abnormal   Collection Time    03/10/14  8:50 PM      Result Value Ref Range   WBC 20.0 (*)  4.0 - 10.5 K/uL   RBC 4.72  4.22 - 5.81 MIL/uL   Hemoglobin 14.4  13.0 - 17.0 g/dL   HCT 40.6  39.0 - 52.0 %   MCV 86.0  78.0 - 100.0 fL   MCH 30.5  26.0 - 34.0 pg   MCHC 35.5  30.0 - 36.0 g/dL   RDW 12.7  11.5 - 15.5 %   Platelets 336  150 - 400 K/uL   Neutrophils Relative % 83 (*) 43 - 77 %   Neutro Abs 16.7 (*) 1.7 - 7.7 K/uL   Lymphocytes Relative 12  12 - 46 %   Lymphs Abs 2.4  0.7 - 4.0 K/uL   Monocytes Relative 4  3 - 12 %   Monocytes Absolute 0.9  0.1 - 1.0 K/uL   Eosinophils Relative 0  0 - 5 %   Eosinophils Absolute 0.0  0.0 - 0.7 K/uL   Basophils Relative 0  0 - 1 %   Basophils Absolute 0.0  0.0 - 0.1 K/uL  COMPREHENSIVE METABOLIC PANEL     Status: Abnormal   Collection Time    03/10/14  8:50 PM      Result Value Ref Range   Sodium 132 (*) 137 - 147 mEq/L   Potassium 3.9  3.7 - 5.3 mEq/L   Chloride 96  96 - 112 mEq/L   CO2 14 (*) 19 - 32 mEq/L   Glucose, Bld 322 (*) 70 - 99 mg/dL   BUN 9  6 - 23 mg/dL   Creatinine, Ser 0.59  0.50 - 1.35 mg/dL    Calcium 9.2  8.4 - 10.5 mg/dL   Total Protein 6.9  6.0 - 8.3 g/dL   Albumin 2.5 (*) 3.5 - 5.2 g/dL   AST 13  0 - 37 U/L   ALT 22  0 - 53 U/L   Alkaline Phosphatase 81  39 - 117 U/L   Total Bilirubin 0.5  0.3 - 1.2 mg/dL   GFR calc non Af Amer >90  >90 mL/min   GFR calc Af Amer >90  >90 mL/min   Comment: (NOTE)     The eGFR has been calculated using the CKD EPI equation.     This calculation has not been validated in all clinical situations.     eGFR's persistently <90 mL/min signify possible Chronic Kidney     Disease.  PROTIME-INR     Status: None   Collection Time    03/10/14 10:10 PM      Result Value Ref Range   Prothrombin Time 14.8  11.6 - 15.2 seconds   INR 1.19  0.00 - 1.49  APTT     Status: None   Collection Time    03/10/14 10:10 PM      Result Value Ref Range   aPTT 35  24 - 37 seconds  CBC     Status: Abnormal   Collection Time    03/11/14  4:40 AM      Result Value Ref Range   WBC 21.1 (*) 4.0 - 10.5 K/uL   RBC 4.95  4.22 - 5.81 MIL/uL   Hemoglobin 15.2  13.0 - 17.0 g/dL   HCT 43.2  39.0 - 52.0 %   MCV 87.3  78.0 - 100.0 fL   MCH 30.7  26.0 - 34.0 pg   MCHC 35.2  30.0 - 36.0 g/dL   RDW 12.9  11.5 - 15.5 %   Platelets 354  150 - 400 K/uL  COMPREHENSIVE METABOLIC PANEL     Status: Abnormal   Collection Time    03/11/14  4:40 AM      Result Value Ref Range   Sodium 134 (*) 137 - 147 mEq/L   Potassium 4.2  3.7 - 5.3 mEq/L   Chloride 96  96 - 112 mEq/L   CO2 14 (*) 19 - 32 mEq/L   Glucose, Bld 295 (*) 70 - 99 mg/dL   BUN 9  6 - 23 mg/dL   Creatinine, Ser 0.69  0.50 - 1.35 mg/dL   Calcium 9.5  8.4 - 10.5 mg/dL   Total Protein 7.1  6.0 - 8.3 g/dL   Albumin 2.6 (*) 3.5 - 5.2 g/dL   AST 12  0 - 37 U/L   ALT 22  0 - 53 U/L   Alkaline Phosphatase 88  39 - 117 U/L   Total Bilirubin 0.5  0.3 - 1.2 mg/dL   GFR calc non Af Amer >90  >90 mL/min   GFR calc Af Amer >90  >90 mL/min   Comment: (NOTE)     The eGFR has been calculated using the CKD EPI equation.      This calculation has not been validated in all clinical situations.     eGFR's persistently <90 mL/min signify possible Chronic Kidney     Disease.  GLUCOSE, CAPILLARY     Status: Abnormal   Collection Time    03/11/14 11:24 AM      Result Value Ref Range   Glucose-Capillary 275 (*) 70 - 99 mg/dL   Comment 1 Notify RN      Dg Chest 1 View  03/09/2014   CLINICAL DATA:  Status post right-sided thoracentesis  EXAM: CHEST - 1 VIEW  COMPARISON:  PA and lateral chest x-ray dated March 08, 2014  FINDINGS: There remains volume loss on the right little changed from yesterday's study. There is no evidence of a pneumothorax. The left lung is adequately inflated and clear. The cardiac silhouette where visualized appears mildly enlarged. The pulmonary vascularity on the left is normal. The is obscured on the right.  IMPRESSION: There is no evidence of a postprocedure pneumothorax following the right thoracentesis. The degree of volume loss on the right is little changed since yesterday's study.   Electronically Signed   By: David  Martinique   On: 03/09/2014 14:24   Ct Angio Chest Pe W/cm &/or Wo Cm  03/10/2014   CLINICAL DATA:  Short of breath and cough.  Recent pneumonia  EXAM: CT ANGIOGRAPHY CHEST WITH CONTRAST  TECHNIQUE: Multidetector CT imaging of the chest was performed using the standard protocol during bolus administration of intravenous contrast. Multiplanar CT image reconstructions and MIPs were obtained to evaluate the vascular anatomy.  CONTRAST:  67m OMNIPAQUE IOHEXOL 350 MG/ML SOLN  COMPARISON:  CXR 03/09/2014  FINDINGS: Negative for pulmonary embolism.  Negative for aortic dissection or aneurysm. Heart size is normal. Mild coronary artery calcification noted. Negative for pericardial effusion.  Large loculated right pleural effusion, suspicious for empyema. Compressive atelectasis in the right lower lobe.  Negative for infiltrate or effusion in the left chest.  Fatty infiltration of the liver with no  focal liver lesion identified. Spleen is normal in size.  Review of the MIP images confirms the above findings.  IMPRESSION: Negative for pulmonary embolism.  Large loculated right pleural effusion, worrisome for empyema. There is compressive atelectasis in the right lower lobe.  Fatty liver   Electronically Signed   By: CJuanda Crumble  Carlis Abbott M.D.   On: 03/10/2014 12:38   US Thoracentesis Asp Pleural Space W/img Guide  03/09/2014   CLINICAL DATA:  Pneumonia, cough, right pleural effusion. Request is made for diagnostic/therapeutic right thoracentesis  EXAM: ULTRASOUND GUIDED DIAGNOSTIC RIGHT THORACENTESIS  COMPARISON:  None.  PROCEDURE: An ultrasound guided thoracentesis was thoroughly discussed with the patient and questions answered. The benefits, risks, alternatives and complications were also discussed. The patient understands and wishes to proceed with the procedure. Written consent was obtained.  Ultrasound was performed to localize and mark an adequate pocket of fluid in the right chest. The area was then prepped and draped in the normal sterile fashion. 1% Lidocaine was used for local anesthesia. Under ultrasound guidance a 19 gauge Yueh catheter was introduced. Thoracentesis was performed. The catheter was removed and a dressing applied.  Complications:  None.  FINDINGS: A total of approximately 140 cc's of turbid, yellow fluid was removed. The fluid sample wassent for laboratory analysis. The pleural fluid collection was multiloculated in nature. Only the above amount of fluid could be removed at this time.  IMPRESSION: Successful ultrasound guided diagnostic right thoracentesis yielding 140 cc's of pleural fluid. Pleural fluid collection appears multiloculated on ultrasound imaging. Recommend follow-up CT chest for further evaluation.  Read by: Rowe Robert ,P.A.-C.   Electronically Signed   By: Daryll Brod M.D.   On: 03/09/2014 14:43    Review of Systems  Constitutional: Positive for malaise/fatigue.  Negative for fever, chills and diaphoresis.  Respiratory: Positive for cough and shortness of breath.   Cardiovascular: Positive for chest pain (right sided, pleuritic).  All other systems reviewed and are negative.   Blood pressure 165/92, pulse 123, temperature 98.7 F (37.1 C), temperature source Oral, resp. rate 18, height _0  (1.651 m), weight 219 lb 2.2 oz (99.4 kg), SpO2 96.00%. Physical Exam  Vitals reviewed. Constitutional: He is oriented to person, place, and time. He appears well-developed and well-nourished. No distress.  HENT:  Head: Normocephalic and atraumatic.  Eyes: EOM are normal. Pupils are equal, round, and reactive to light.  Neck: Neck supple. No thyromegaly present.  Cardiovascular: Normal rate, regular rhythm, normal heart sounds and intact distal pulses.  Exam reveals no friction rub.   No murmur heard. Respiratory: Effort normal.  Absent BS right lower 2/3 of chest  GI: Soft. There is no tenderness.  Musculoskeletal: He exhibits no edema.  Lymphadenopathy:    He has no cervical adenopathy.  Neurological: He is alert and oriented to person, place, and time. No cranial nerve deficit.  No focal deficit  Skin: Skin is warm and dry.    Assessment/Plan: 43 yo diabetic with a loculated right pleural effusion. This is most likely a para-pneumonic effusion, if not an early organizing empyema. It will require surgical drainage.  He has been started on appropriate IV antibiotics with zosyn and vancomycin  I reviewed the CT with Dean Wallace and his family. I explained the nature of the effusion and the reasons why surgical intervention was necessary. I discussed with them the general nature of the procedure, the need for general anesthesia, and the incisions to be used. We discussed the expected hospital stay, overall recovery and short and long term outcomes. They understand the risks include, but are not limited to death, stroke, MI, DVT/PE, bleeding, possible need  for transfusion, infections, air leaks, and other organ system dysfunction. He accepts the risks of surgery and wishes to proceed.  Will see if we can schedule in OR for  tomorrow  Melrose Nakayama 03/11/2014, 12:41 PM

## 2014-03-11 NOTE — Progress Notes (Signed)
PROGRESS NOTE  Dean Wallace UQJ:[id number] DOB: 01/01/1971 DOA: 03/10/2014 PCP: Crisoforo Oxford, PA-C  Assessment/Plan: Empyema of the right pleural space  Called for consult for CT surgery, Dr Roxan Hockey for possible chest tube versus VATS  - continue antibiotics vancomycin and Zosyn.  - pleural fluid cultures pending, no growth so far.  Diabetes mellitus - sliding-scale insulin, hemoglobin A1c in process HTN - monitor for now Leukocytosis - due to #1  Diet: NPO Fluids: NS at 75 cc/h DVT Prophylaxis: lovenox  Code Status: Full Family Communication: d/w patient  Disposition Plan: inpatient  Consultants:  Dr. Roxan Hockey surgery   Procedures:  none   Antibiotics Vancomycin 3/27 >> Zosyn 3/27 >>  HPI/Subjective: - feeling well this morning, endorses back pain, no chest pain, breathing comfortable on [place].  Objective: Filed Vitals:   03/10/14 1631 03/10/14 2021 03/11/14 0442  BP: 155/93 173/91 165/92  Pulse: 132 126 123  Temp: 98.2 F (36.8 C) 97.4 F (36.3 C) 98.7 F (37.1 C)  TempSrc: Oral Oral Oral  Resp: 22 20 18   Height: 5' 5"  (1.651 m)    Weight: 99.4 kg (219 lb 2.2 oz)    SpO2: 96% 95% 96%    Intake/Output Summary (Last 24 hours) at 03/11/14 0837 Last data filed at 03/11/14 0700  Gross per 24 hour  Intake      0 ml  Output      0 ml  Net      0 ml   Filed Weights   03/10/14 1631  Weight: 99.4 kg (219 lb 2.2 oz)    Exam:  General:  NAD  Cardiovascular: regular rate and rhythm, without MRG, tachycardic  Respiratory: good air movement, no wheezing, decreased breath sounds on right lower base  Abdomen: soft, not tender to palpation, positive bowel sounds  MSK: no peripheral edema  Neuro: CN 2-12 grossly intact, MS 5/5 in all 4  Data Reviewed: Basic Metabolic Panel:  Recent Labs Lab 03/08/14 1206 03/10/14 2050 03/11/14 0440  NA 136 132* 134*  K 3.9 3.9 4.2  CL 100 96 96  CO2 19 14* 14*  GLUCOSE 288* 322* 295*  BUN 11  9 9   CREATININE 0.68 0.59 0.69  CALCIUM 9.6 9.2 9.5   Liver Function Tests:  Recent Labs Lab 03/10/14 2050 03/11/14 0440  AST 13 12  ALT 22 22  ALKPHOS 81 88  BILITOT 0.5 0.5  PROT 6.9 7.1  ALBUMIN 2.5* 2.6*   CBC:  Recent Labs Lab 03/08/14 1206 03/10/14 2050 03/11/14 0440  WBC 20.2* 20.0* 21.1*  NEUTROABS  --  16.7*  --   HGB 14.8 14.4 15.2  HCT 43.1 40.6 43.2  MCV 85.7 86.0 87.3  PLT 329 336 354   Recent Results (from the past 240 hour(s))  BODY FLUID CULTURE     Status: None   Collection Time    03/09/14  2:24 PM      Result Value Ref Range Status   Specimen Description PLEURAL   Final   Special Requests NONE   Final   Gram Stain     Final   Value: FEW WBC PRESENT,BOTH PMN AND MONONUCLEAR     NO ORGANISMS SEEN     Performed at [facility]   Culture     Final   Value: NO GROWTH 1 DAY     Performed at [facility]   Report Status PENDING   Incomplete   Studies: Dg Chest 1 View  03/09/2014  CLINICAL DATA:  Status post right-sided thoracentesis  EXAM: CHEST - 1 VIEW  COMPARISON:  PA and lateral chest x-ray dated March 08, 2014  FINDINGS: There remains volume loss on the right little changed from yesterday's study. There is no evidence of a pneumothorax. The left lung is adequately inflated and clear. The cardiac silhouette where visualized appears mildly enlarged. The pulmonary vascularity on the left is normal. The is obscured on the right.  IMPRESSION: There is no evidence of a postprocedure pneumothorax following the right thoracentesis. The degree of volume loss on the right is little changed since yesterday's study.   Electronically Signed   By: David  Martinique   On: 03/09/2014 14:24   Ct Angio Chest Pe W/cm &/or Wo Cm  03/10/2014   CLINICAL DATA:  Short of breath and cough.  Recent pneumonia  EXAM: CT ANGIOGRAPHY CHEST WITH CONTRAST  TECHNIQUE: Multidetector CT imaging of the chest was performed using the standard protocol during bolus  administration of intravenous contrast. Multiplanar CT image reconstructions and MIPs were obtained to evaluate the vascular anatomy.  CONTRAST:  67m OMNIPAQUE IOHEXOL 350 MG/ML SOLN  COMPARISON:  CXR 03/09/2014  FINDINGS: Negative for pulmonary embolism.  Negative for aortic dissection or aneurysm. Heart size is normal. Mild coronary artery calcification noted. Negative for pericardial effusion.  Large loculated right pleural effusion, suspicious for empyema. Compressive atelectasis in the right lower lobe.  Negative for infiltrate or effusion in the left chest.  Fatty infiltration of the liver with no focal liver lesion identified. Spleen is normal in size.  Review of the MIP images confirms the above findings.  IMPRESSION: Negative for pulmonary embolism.  Large loculated right pleural effusion, worrisome for empyema. There is compressive atelectasis in the right lower lobe.  Fatty liver   Electronically Signed   By: CFranchot GalloM.D.   On: 03/10/2014 12:38   UKoreaThoracentesis Asp Pleural Space W/img Guide  03/09/2014   CLINICAL DATA:  Pneumonia, cough, right pleural effusion. Request is made for diagnostic/therapeutic right thoracentesis  EXAM: ULTRASOUND GUIDED DIAGNOSTIC RIGHT THORACENTESIS  COMPARISON:  None.  PROCEDURE: An ultrasound guided thoracentesis was thoroughly discussed with the patient and questions answered. The benefits, risks, alternatives and complications were also discussed. The patient understands and wishes to proceed with the procedure. Written consent was obtained.  Ultrasound was performed to localize and mark an adequate pocket of fluid in the right chest. The area was then prepped and draped in the normal sterile fashion. 1% Lidocaine was used for local anesthesia. Under ultrasound guidance a 19 gauge Yueh catheter was introduced. Thoracentesis was performed. The catheter was removed and a dressing applied.  Complications:  None.  FINDINGS: A total of approximately 140 cc's of  turbid, yellow fluid was removed. The fluid sample wassent for laboratory analysis. The pleural fluid collection was multiloculated in nature. Only the above amount of fluid could be removed at this time.  IMPRESSION: Successful ultrasound guided diagnostic right thoracentesis yielding 140 cc's of pleural fluid. Pleural fluid collection appears multiloculated on ultrasound imaging. Recommend follow-up CT chest for further evaluation.  Read by: KRowe Robert,P.A.-C.   Electronically Signed   By: TDaryll BrodM.D.   On: 03/09/2014 14:43   Scheduled Meds: . enoxaparin (LOVENOX) injection  40 mg Subcutaneous Q24H  . piperacillin-tazobactam (ZOSYN)  IV  3.375 g Intravenous Q8H  . vancomycin  1,000 mg Intravenous Q8H   Continuous Infusions: . sodium chloride 75 mL/hr at 03/10/14 1901    Active  Problems:   Empyema of right pleural space   DM (diabetes mellitus)  Time spent: 35  This note has been created with Surveyor, quantity. Any transcriptional errors are unintentional.   Marzetta Board, MD [facility] Pager [phone]. If 7 PM - 7 AM, please contact night-coverage at [email or web address], password [email or web address] 03/11/2014, 8:37 AM  LOS: 1 day

## 2014-03-11 NOTE — Progress Notes (Signed)
Nutrition Brief Note  Patient identified on the Malnutrition Screening Tool (MST) Report  Wt Readings from Last 15 Encounters:  03/10/14 219 lb 2.2 oz (99.4 kg)  03/10/14 221 lb (100.245 kg)  03/08/14 234 lb (106.142 kg)    Body mass index is 36.47 kg/(m^2). Patient meets criteria for obesity based on current BMI.   Current diet order is CHO Modified Medium, patient is consuming approximately 75% of meals x1 at this time. Labs and medications reviewed.   Pt states he has lost weight due to fluid loss.  Reports his appetite has been decreased for the past 3-4 days, however improved today.  No nutrition interventions warranted at this time. If nutrition issues arise, please consult RD.   Brynda Greathouse, MS RD LDN Clinical Inpatient Dietitian Pager: [phone] Weekend/After hours pager: [phone]

## 2014-03-11 NOTE — Progress Notes (Signed)
Md paged and aware of pre op lab results. New orders given. Will continue to monitor.

## 2014-03-12 ENCOUNTER — Encounter (HOSPITAL_COMMUNITY): Payer: Self-pay | Admitting: Anesthesiology

## 2014-03-12 ENCOUNTER — Encounter (HOSPITAL_COMMUNITY): Payer: 59 | Admitting: Anesthesiology

## 2014-03-12 ENCOUNTER — Encounter (HOSPITAL_COMMUNITY)
Admission: AD | Disposition: A | Discharge status: Home / Self Care | Payer: Self-pay | Source: Ambulatory Visit | Attending: Internal Medicine

## 2014-03-12 ENCOUNTER — Inpatient Hospital Stay (HOSPITAL_COMMUNITY): Payer: 59 | Admitting: Anesthesiology

## 2014-03-12 ENCOUNTER — Inpatient Hospital Stay (HOSPITAL_COMMUNITY): Payer: 59

## 2014-03-12 DIAGNOSIS — J90 Pleural effusion, not elsewhere classified: Secondary | ICD-10-CM

## 2014-03-12 HISTORY — PX: DECORTICATION: SHX5101

## 2014-03-12 HISTORY — PX: VIDEO ASSISTED THORACOSCOPY (VATS)/EMPYEMA: SHX6172

## 2014-03-12 LAB — GLUCOSE, CAPILLARY
GLUCOSE-CAPILLARY: 328 mg/dL — AB (ref 70–99)
GLUCOSE-CAPILLARY: 313 mg/dL — AB (ref 70–99)
GLUCOSE-CAPILLARY: 127 mg/dL — AB (ref 70–99)
GLUCOSE-CAPILLARY: 114 mg/dL — AB (ref 70–99)
Glucose-Capillary: 303 mg/dL — ABNORMAL HIGH (ref 70–99)
Glucose-Capillary: 296 mg/dL — ABNORMAL HIGH (ref 70–99)
Glucose-Capillary: 245 mg/dL — ABNORMAL HIGH (ref 70–99)
Glucose-Capillary: 173 mg/dL — ABNORMAL HIGH (ref 70–99)

## 2014-03-12 LAB — CBC WITH DIFFERENTIAL/PLATELET
BASOS ABS: 0 10*3/uL (ref 0.0–0.1)
BASOS PCT: 0 % (ref 0–1)
EOS ABS: 0 10*3/uL (ref 0.0–0.7)
Eosinophils Relative: 0 % (ref 0–5)
HCT: 43 % (ref 39.0–52.0)
Hemoglobin: 15.2 g/dL (ref 13.0–17.0)
Lymphocytes Relative: 12 % (ref 12–46)
Lymphs Abs: 2.6 10*3/uL (ref 0.7–4.0)
MCH: 30.8 pg (ref 26.0–34.0)
MCHC: 35.3 g/dL (ref 30.0–36.0)
MCV: 87.2 fL (ref 78.0–100.0)
Monocytes Absolute: 0.6 10*3/uL (ref 0.1–1.0)
Monocytes Relative: 3 % (ref 3–12)
NEUTROS PCT: 85 % — AB (ref 43–77)
Neutro Abs: 17.9 10*3/uL — ABNORMAL HIGH (ref 1.7–7.7)
PLATELETS: 354 10*3/uL (ref 150–400)
RBC: 4.93 MIL/uL (ref 4.22–5.81)
RDW: 13 % (ref 11.5–15.5)
WBC: 21 10*3/uL — ABNORMAL HIGH (ref 4.0–10.5)

## 2014-03-12 LAB — BODY FLUID CULTURE

## 2014-03-12 LAB — POCT I-STAT GLUCOSE
Glucose, Bld: 342 mg/dL — ABNORMAL HIGH (ref 70–99)
Glucose, Bld: 304 mg/dL — ABNORMAL HIGH (ref 70–99)
Operator id: 117071
Operator id: 117071

## 2014-03-12 LAB — BASIC METABOLIC PANEL
BUN: 11 mg/dL (ref 6–23)
CO2: 15 mEq/L — ABNORMAL LOW (ref 19–32)
Calcium: 9.7 mg/dL (ref 8.4–10.5)
Chloride: 96 mEq/L (ref 96–112)
Creatinine, Ser: 0.62 mg/dL (ref 0.50–1.35)
GFR calc non Af Amer: >90 mL/min (ref >90)
Glucose, Bld: 323 mg/dL — ABNORMAL HIGH (ref 70–99)
POTASSIUM: 4.2 meq/L (ref 3.7–5.3)
SODIUM: 133 meq/L — AB (ref 137–147)

## 2014-03-12 LAB — SURGICAL PCR SCREEN
MRSA, PCR: NEGATIVE
STAPHYLOCOCCUS AUREUS: NEGATIVE

## 2014-03-12 SURGERY — VIDEO ASSISTED THORACOSCOPY (VATS)/EMPYEMA
Anesthesia: General | Site: Chest | Laterality: Right

## 2014-03-12 MED ORDER — KETOROLAC TROMETHAMINE 30 MG/ML IJ SOLN
INTRAMUSCULAR | Status: AC
  Filled 2014-03-12: qty 1

## 2014-03-12 MED ORDER — VECURONIUM BROMIDE 10 MG IV SOLR
INTRAVENOUS | Status: DC | PRN
  Administered 2014-03-12: 2 mg via INTRAVENOUS

## 2014-03-12 MED ORDER — ONDANSETRON HCL 4 MG/2ML IJ SOLN
4.0000 mg | Freq: Four times a day (QID) | INTRAMUSCULAR | Status: DC | PRN
Start: 2014-03-12 — End: 2014-03-17

## 2014-03-12 MED ORDER — FENTANYL 10 MCG/ML IV SOLN
INTRAVENOUS | Status: DC
  Administered 2014-03-12: 240 ug via INTRAVENOUS
  Administered 2014-03-12: 169 ug via INTRAVENOUS
  Administered 2014-03-12: 45 ug via INTRAVENOUS
  Administered 2014-03-12: 13:00:00 via INTRAVENOUS
  Administered 2014-03-13: 150 ug via INTRAVENOUS
  Administered 2014-03-13: 17:00:00 via INTRAVENOUS
  Administered 2014-03-13: 86 ug via INTRAVENOUS
  Administered 2014-03-13: 90 ug via INTRAVENOUS
  Administered 2014-03-13: 45 ug via INTRAVENOUS
  Administered 2014-03-13: 60 ug via INTRAVENOUS
  Administered 2014-03-13: 105 ug via INTRAVENOUS
  Administered 2014-03-14: 300 ug via INTRAVENOUS
  Administered 2014-03-14: 105 ug via INTRAVENOUS
  Administered 2014-03-14: 90 ug via INTRAVENOUS
  Administered 2014-03-14: 15:00:00 via INTRAVENOUS
  Administered 2014-03-14: 60 ug via INTRAVENOUS
  Administered 2014-03-14: 90 ug via INTRAVENOUS
  Administered 2014-03-15: 150 ug via INTRAVENOUS
  Administered 2014-03-15: 75 ug via INTRAVENOUS
  Administered 2014-03-15: 105 ug via INTRAVENOUS
  Administered 2014-03-15: 195 ug via INTRAVENOUS
  Administered 2014-03-15: 04:00:00 via INTRAVENOUS
  Administered 2014-03-15: 120 ug via INTRAVENOUS
  Administered 2014-03-16: 12 ug via INTRAVENOUS
  Administered 2014-03-16: 62.9 ug via INTRAVENOUS
  Administered 2014-03-16: 01:00:00 via INTRAVENOUS
  Administered 2014-03-16: 74 ug via INTRAVENOUS
  Filled 2014-03-12 (×6): qty 50

## 2014-03-12 MED ORDER — POTASSIUM CHLORIDE IN NACL 20-0.9 MEQ/L-% IV SOLN
INTRAVENOUS | Status: DC
  Administered 2014-03-12 – 2014-03-13 (×4): via INTRAVENOUS
  Administered 2014-03-15: 20 mL/h via INTRAVENOUS
  Filled 2014-03-12 (×8): qty 1000

## 2014-03-12 MED ORDER — OXYCODONE HCL 5 MG PO TABS: 5.0000 mg | ORAL_TABLET | ORAL | Status: AC | PRN

## 2014-03-12 MED ORDER — LIDOCAINE HCL (CARDIAC) 20 MG/ML IV SOLN
INTRAVENOUS | Status: AC
  Filled 2014-03-12: qty 5

## 2014-03-12 MED ORDER — METOCLOPRAMIDE HCL 5 MG/ML IJ SOLN: 10.0000 mg | Freq: Once | INTRAMUSCULAR | Status: DC | PRN

## 2014-03-12 MED ORDER — ACETAMINOPHEN 500 MG PO TABS
1000.0000 mg | ORAL_TABLET | Freq: Four times a day (QID) | ORAL | Status: AC
  Administered 2014-03-12 – 2014-03-13 (×4): 1000 mg via ORAL
  Filled 2014-03-12 (×4): qty 2

## 2014-03-12 MED ORDER — DIPHENHYDRAMINE HCL 12.5 MG/5ML PO ELIX
12.5000 mg | ORAL_SOLUTION | Freq: Four times a day (QID) | ORAL | Status: DC | PRN
  Filled 2014-03-12: qty 5

## 2014-03-12 MED ORDER — FENTANYL CITRATE 0.05 MG/ML IJ SOLN
INTRAMUSCULAR | Status: AC
  Filled 2014-03-12: qty 5

## 2014-03-12 MED ORDER — NALOXONE HCL 0.4 MG/ML IJ SOLN: 0.4000 mg | INTRAMUSCULAR | Status: DC | PRN

## 2014-03-12 MED ORDER — OXYCODONE HCL 5 MG/5ML PO SOLN: 5.0000 mg | Freq: Once | ORAL | Status: DC | PRN

## 2014-03-12 MED ORDER — ROCURONIUM BROMIDE 100 MG/10ML IV SOLN
INTRAVENOUS | Status: DC | PRN
  Administered 2014-03-12: 50 mg via INTRAVENOUS

## 2014-03-12 MED ORDER — VECURONIUM BROMIDE 10 MG IV SOLR
INTRAVENOUS | Status: AC
  Filled 2014-03-12: qty 10

## 2014-03-12 MED ORDER — PROPOFOL 10 MG/ML IV BOLUS
INTRAVENOUS | Status: AC
  Filled 2014-03-12: qty 20

## 2014-03-12 MED ORDER — OXYCODONE-ACETAMINOPHEN 5-325 MG PO TABS
1.0000 | ORAL_TABLET | ORAL | Status: DC | PRN
  Administered 2014-03-16 – 2014-03-17 (×3): 2 via ORAL
  Filled 2014-03-12 (×3): qty 2

## 2014-03-12 MED ORDER — FENTANYL CITRATE 0.05 MG/ML IJ SOLN
INTRAMUSCULAR | Status: DC | PRN
  Administered 2014-03-12: 100 ug via INTRAVENOUS
  Administered 2014-03-12 (×3): 50 ug via INTRAVENOUS
  Administered 2014-03-12: 200 ug via INTRAVENOUS
  Administered 2014-03-12: 50 ug via INTRAVENOUS
  Administered 2014-03-12: 100 ug via INTRAVENOUS
  Administered 2014-03-12 (×3): 50 ug via INTRAVENOUS

## 2014-03-12 MED ORDER — ROCURONIUM BROMIDE 50 MG/5ML IV SOLN
INTRAVENOUS | Status: AC
  Filled 2014-03-12: qty 1

## 2014-03-12 MED ORDER — SODIUM CHLORIDE 0.9 % IV SOLN
INTRAVENOUS | Status: DC | PRN
  Administered 2014-03-12: 09:00:00 via INTRAVENOUS

## 2014-03-12 MED ORDER — LUNG SURGERY BOOK
Freq: Once | Status: AC
  Administered 2014-03-13
  Filled 2014-03-12: qty 1

## 2014-03-12 MED ORDER — SODIUM CHLORIDE 0.9 % IV SOLN
100.0000 [IU] | INTRAVENOUS | Status: DC | PRN
  Administered 2014-03-12: 8.5 [IU]/h via INTRAVENOUS

## 2014-03-12 MED ORDER — KETOROLAC TROMETHAMINE 30 MG/ML IJ SOLN
30.0000 mg | Freq: Four times a day (QID) | INTRAMUSCULAR | Status: AC
  Administered 2014-03-12 – 2014-03-14 (×8): 30 mg via INTRAVENOUS
  Filled 2014-03-12 (×7): qty 1

## 2014-03-12 MED ORDER — SODIUM CHLORIDE 0.9 % IV SOLN
INTRAVENOUS | Status: DC | PRN
  Administered 2014-03-12 (×2): via INTRAVENOUS

## 2014-03-12 MED ORDER — ONDANSETRON HCL 4 MG/2ML IJ SOLN: 4.0000 mg | Freq: Four times a day (QID) | INTRAMUSCULAR | Status: DC | PRN

## 2014-03-12 MED ORDER — MIDAZOLAM HCL 2 MG/2ML IJ SOLN
INTRAMUSCULAR | Status: AC
  Filled 2014-03-12: qty 2

## 2014-03-12 MED ORDER — SENNOSIDES-DOCUSATE SODIUM 8.6-50 MG PO TABS
1.0000 | ORAL_TABLET | Freq: Every evening | ORAL | Status: DC | PRN
  Filled 2014-03-12: qty 1

## 2014-03-12 MED ORDER — HYDROMORPHONE HCL PF 1 MG/ML IJ SOLN
0.2500 mg | INTRAMUSCULAR | Status: DC | PRN
  Administered 2014-03-12 (×2): 0.5 mg via INTRAVENOUS

## 2014-03-12 MED ORDER — MIDAZOLAM HCL 5 MG/5ML IJ SOLN
INTRAMUSCULAR | Status: DC | PRN
  Administered 2014-03-12 (×2): 1 mg via INTRAVENOUS

## 2014-03-12 MED ORDER — SODIUM CHLORIDE 0.9 % IJ SOLN: 9.0000 mL | INTRAMUSCULAR | Status: DC | PRN

## 2014-03-12 MED ORDER — POTASSIUM CHLORIDE 10 MEQ/50ML IV SOLN
10.0000 meq | Freq: Every day | INTRAVENOUS | Status: DC | PRN
  Administered 2014-03-13 – 2014-03-15 (×8): 10 meq via INTRAVENOUS
  Filled 2014-03-12 (×8): qty 50

## 2014-03-12 MED ORDER — VANCOMYCIN HCL IN DEXTROSE 1-5 GM/200ML-% IV SOLN
INTRAVENOUS | Status: AC
  Filled 2014-03-12: qty 200

## 2014-03-12 MED ORDER — PANTOPRAZOLE SODIUM 40 MG PO TBEC
40.0000 mg | DELAYED_RELEASE_TABLET | Freq: Every day | ORAL | Status: DC
  Administered 2014-03-13 – 2014-03-16 (×4): 40 mg via ORAL
  Filled 2014-03-12 (×4): qty 1

## 2014-03-12 MED ORDER — INSULIN ASPART 100 UNIT/ML ~~LOC~~ SOLN
4.0000 [IU] | Freq: Once | SUBCUTANEOUS | Status: AC
  Administered 2014-03-12: 4 [IU] via SUBCUTANEOUS

## 2014-03-12 MED ORDER — ENOXAPARIN SODIUM 40 MG/0.4ML ~~LOC~~ SOLN
40.0000 mg | SUBCUTANEOUS | Status: DC
  Administered 2014-03-13 – 2014-03-16 (×4): 40 mg via SUBCUTANEOUS
  Filled 2014-03-12 (×5): qty 0.4

## 2014-03-12 MED ORDER — BISACODYL 5 MG PO TBEC
10.0000 mg | DELAYED_RELEASE_TABLET | Freq: Every day | ORAL | Status: DC
  Administered 2014-03-13 – 2014-03-15 (×3): 10 mg via ORAL
  Filled 2014-03-12 (×4): qty 2

## 2014-03-12 MED ORDER — PROPOFOL 10 MG/ML IV BOLUS
INTRAVENOUS | Status: DC | PRN
  Administered 2014-03-12: 200 mg via INTRAVENOUS

## 2014-03-12 MED ORDER — OXYCODONE HCL 5 MG PO TABS: 5.0000 mg | ORAL_TABLET | Freq: Once | ORAL | Status: DC | PRN

## 2014-03-12 MED ORDER — DIPHENHYDRAMINE HCL 50 MG/ML IJ SOLN: 12.5000 mg | Freq: Four times a day (QID) | INTRAMUSCULAR | Status: DC | PRN

## 2014-03-12 MED ORDER — ACETAMINOPHEN 160 MG/5ML PO SOLN: 1000.0000 mg | Freq: Four times a day (QID) | ORAL | Status: AC

## 2014-03-12 MED ORDER — SODIUM CHLORIDE 0.9 % IV SOLN
INTRAVENOUS | Status: DC
  Administered 2014-03-12: 13 [IU]/h via INTRAVENOUS
  Filled 2014-03-12 (×2): qty 1

## 2014-03-12 MED ORDER — HYDROMORPHONE HCL PF 1 MG/ML IJ SOLN
INTRAMUSCULAR | Status: AC
  Administered 2014-03-12: 0.5 mg via INTRAVENOUS
  Filled 2014-03-12: qty 1

## 2014-03-12 MED ORDER — TRAMADOL HCL 50 MG PO TABS: 50.0000 mg | ORAL_TABLET | Freq: Four times a day (QID) | ORAL | Status: DC | PRN

## 2014-03-12 SURGICAL SUPPLY — 67 items
CANISTER SUCTION 2500CC (MISCELLANEOUS) ×4 IMPLANT
CATH KIT ON Q 5IN SLV (PAIN MANAGEMENT) IMPLANT
CATH THORACIC 28FR (CATHETERS) IMPLANT
CATH THORACIC 36FR (CATHETERS) IMPLANT
CATH THORACIC 36FR RT ANG (CATHETERS) IMPLANT
CLIP TI MEDIUM 6 (CLIP) ×2 IMPLANT
CONN 1/2X3/8X3/8 Y GISH (MISCELLANEOUS) ×2 IMPLANT
CONT SPEC 4OZ CLIKSEAL STRL BL (MISCELLANEOUS) ×8 IMPLANT
DERMABOND ADVANCED (GAUZE/BANDAGES/DRESSINGS) ×1
DERMABOND ADVANCED .7 DNX12 (GAUZE/BANDAGES/DRESSINGS) ×1 IMPLANT
DRAIN CHANNEL 28F RND 3/8 FF (WOUND CARE) IMPLANT
DRAIN CHANNEL 32F RND 10.7 FF (WOUND CARE) ×4 IMPLANT
DRAPE LAPAROSCOPIC ABDOMINAL (DRAPES) ×2 IMPLANT
DRAPE WARM FLUID 44X44 (DRAPE) ×4 IMPLANT
ELECT REM PT RETURN 9FT ADLT (ELECTROSURGICAL) ×2
ELECTRODE REM PT RTRN 9FT ADLT (ELECTROSURGICAL) ×1 IMPLANT
GLOVE BIO SURGEON STRL SZ 6.5 (GLOVE) ×2 IMPLANT
GLOVE BIO SURGEON STRL SZ7 (GLOVE) ×4 IMPLANT
GLOVE SURG SIGNA 7.5 PF LTX (GLOVE) ×4 IMPLANT
GOWN STRL REUS W/ TWL LRG LVL3 (GOWN DISPOSABLE) ×6 IMPLANT
GOWN STRL REUS W/ TWL XL LVL3 (GOWN DISPOSABLE) ×2 IMPLANT
GOWN STRL REUS W/TWL LRG LVL3 (GOWN DISPOSABLE) ×6
GOWN STRL REUS W/TWL XL LVL3 (GOWN DISPOSABLE) ×2
KIT BASIN OR (CUSTOM PROCEDURE TRAY) ×2 IMPLANT
KIT ROOM TURNOVER OR (KITS) ×2 IMPLANT
KIT SUCTION CATH 14FR (SUCTIONS) ×2 IMPLANT
NS IRRIG 1000ML POUR BTL (IV SOLUTION) ×4 IMPLANT
PACK CHEST (CUSTOM PROCEDURE TRAY) ×2 IMPLANT
PAD ARMBOARD 7.5X6 YLW CONV (MISCELLANEOUS) ×4 IMPLANT
POUCH ENDO CATCH II 15MM (MISCELLANEOUS) IMPLANT
POUCH SPECIMEN RETRIEVAL 10MM (ENDOMECHANICALS) IMPLANT
SEALANT PROGEL (MISCELLANEOUS) IMPLANT
SEALANT SURG COSEAL 4ML (VASCULAR PRODUCTS) IMPLANT
SEALANT SURG COSEAL 8ML (VASCULAR PRODUCTS) IMPLANT
SOLUTION ANTI FOG 6CC (MISCELLANEOUS) ×6 IMPLANT
SPECIMEN JAR MEDIUM (MISCELLANEOUS) ×2 IMPLANT
SPONGE GAUZE 4X4 12PLY (GAUZE/BANDAGES/DRESSINGS) ×2 IMPLANT
SPONGE INTESTINAL PEANUT (DISPOSABLE) ×2 IMPLANT
SUT PROLENE 4 0 RB 1 (SUTURE)
SUT PROLENE 4-0 RB1 .5 CRCL 36 (SUTURE) IMPLANT
SUT SILK  1 MH (SUTURE) ×2
SUT SILK 1 MH (SUTURE) ×2 IMPLANT
SUT SILK 2 0SH CR/8 30 (SUTURE) IMPLANT
SUT SILK 3 0SH CR/8 30 (SUTURE) IMPLANT
SUT VIC AB 0 CTX 27 (SUTURE) IMPLANT
SUT VIC AB 1 CTX 27 (SUTURE) ×2 IMPLANT
SUT VIC AB 2-0 CT1 27 (SUTURE) ×1
SUT VIC AB 2-0 CT1 TAPERPNT 27 (SUTURE) ×1 IMPLANT
SUT VIC AB 2-0 CTX 36 (SUTURE) IMPLANT
SUT VIC AB 3-0 MH 27 (SUTURE) IMPLANT
SUT VIC AB 3-0 SH 27 (SUTURE)
SUT VIC AB 3-0 SH 27X BRD (SUTURE) IMPLANT
SUT VIC AB 3-0 X1 27 (SUTURE) ×4 IMPLANT
SUT VICRYL 0 UR6 27IN ABS (SUTURE) ×4 IMPLANT
SUT VICRYL 2 TP 1 (SUTURE) IMPLANT
SWAB COLLECTION DEVICE MRSA (MISCELLANEOUS) IMPLANT
SYSTEM SAHARA CHEST DRAIN ATS (WOUND CARE) ×2 IMPLANT
TAPE CLOTH SURG 4X10 WHT LF (GAUZE/BANDAGES/DRESSINGS) ×2 IMPLANT
TIP APPLICATOR SPRAY EXTEND 16 (VASCULAR PRODUCTS) IMPLANT
TOWEL OR 17X24 6PK STRL BLUE (TOWEL DISPOSABLE) ×2 IMPLANT
TOWEL OR 17X26 10 PK STRL BLUE (TOWEL DISPOSABLE) ×4 IMPLANT
TRAP SPECIMEN MUCOUS 40CC (MISCELLANEOUS) ×6 IMPLANT
TRAY FOLEY CATH 16FRSI W/METER (SET/KITS/TRAYS/PACK) ×2 IMPLANT
TROCAR XCEL BLADELESS 5X75MML (TROCAR) ×2 IMPLANT
TUBE ANAEROBIC SPECIMEN COL (MISCELLANEOUS) IMPLANT
TUNNELER SHEATH ON-Q 11GX8 DSP (PAIN MANAGEMENT) IMPLANT
WATER STERILE IRR 1000ML POUR (IV SOLUTION) ×4 IMPLANT

## 2014-03-12 NOTE — Brief Op Note (Addendum)
03/10/2014 - 03/12/2014  11:58 AM  PATIENT:  Dean Wallace  43 y.o. male  PRE-OPERATIVE DIAGNOSIS:  1. Loculated para pneumonic right pleural effusion 2.Empyema  POST-OPERATIVE DIAGNOSIS:  1. Early Organizing Empyema  PROCEDURE:  RIGHT VIDEO ASSISTED THORACOSCOPY (VATS), DRAINAGE of RIGHT EMPYEMA, DECORTICATION   SURGEON:  Surgeon(s) and Role:    * Melrose Nakayama, MD - Primary  PHYSICIAN ASSISTANT: Lars Pinks PA-C   ANESTHESIA:   general  EBL:  Total I/O In: 1400 [I.V.:1400] Out: 800 [Urine:700; Blood:100]  BLOOD ADMINISTERED:none  DRAINS: 1 28 French straight chest tube;2 32 Blake drains. All in the right pleural space    SPECIMEN:  Source of Specimen:  Parietal and visceral pleural biopsies. Pleural fluid sent for culture, cytology, and gram stain.  DISPOSITION OF SPECIMEN:  PATHOLOGY/ MICRO  COUNTS CORRECT:  YES  PLAN OF CARE: Admit to inpatient   PATIENT DISPOSITION:  PACU - hemodynamically stable.   Delay start of Pharmacological VTE agent (>24hrs) due to surgical blood loss or risk of bleeding: yes

## 2014-03-12 NOTE — Progress Notes (Signed)
Some discomfort but breathing much better  BP 141/84  Pulse 118  Temp(Src) 97.9 F (36.6 C) (Oral)  Resp 24  Ht 5' 5"  (1.651 m)  Wt 211 lb 10.3 oz (96 kg)  BMI 35.22 kg/m2  SpO2 95%   Intake/Output Summary (Last 24 hours) at 03/12/14 1953 Last data filed at 03/12/14 1800  Gross per 24 hour  Intake 1662.05 ml  Output   1750 ml  Net -87.95 ml    No air leak  Doing well

## 2014-03-12 NOTE — H&P (View-Only) (Signed)
Reason for Consult:Loculated right pleural effusion Referring Physician: Dr. Darrick Meigs Primary MD: Dean Wallace is an 43 y.o. male.  HPI: 43 yo male who presents with a cc/o cough, back pain and shortness of breath  Dean Wallace is 44 yo male life-long nonsmoker with a history of Type II diabetes. He was in his usual state of health until about 2 weeks PTA. He developed a cough which did not improve with OTC medications. It was nonproductive. He was not having any significant fever, chills or sweats. On Wednesday he developed abrupt onset of right sided back/ chest pain. He thought it was a muscle spasm. It was worsened with deep breaths and changes in position.   He went to his doctor's office. A CXR showed a right pleural effusion. Thoracentesis showed the effusion was exudative. A CT of the chest showed a multiloculated right effusion with atelectasis of the right lower and middle lobes. There was no PE.    Past Medical History  Diagnosis Date  . Diabetes mellitus without complication     Past Surgical History  Procedure Laterality Date  . Lung drained      History reviewed. No pertinent family history.  Social History:  reports that he has never smoked. He does not have any smokeless tobacco history on file. He reports that he does not drink alcohol or use illicit drugs.  Family History: + CAD  Allergies: No Known Allergies  Medications:  Scheduled: . enoxaparin (LOVENOX) injection  40 mg Subcutaneous Q24H  . insulin aspart  0-9 Units Subcutaneous TID WC  . piperacillin-tazobactam (ZOSYN)  IV  3.375 g Intravenous Q8H  . vancomycin  1,000 mg Intravenous Q8H    Results for orders placed during the hospital encounter of 03/10/14 (from the past 48 hour(s))  CULTURE, BLOOD (ROUTINE X 2)     Status: None   Collection Time    03/10/14  8:40 PM      Result Value Ref Range   Specimen Description BLOOD RIGHT ARM     Special Requests BOTTLES DRAWN AEROBIC ONLY 5CC     Culture  Setup Time       Value: 03/10/2014 23:55     Performed at [facility]   Culture       Value:        BLOOD CULTURE RECEIVED NO GROWTH TO DATE CULTURE WILL BE HELD FOR 5 DAYS BEFORE ISSUING A FINAL NEGATIVE REPORT     Performed at [facility]   Report Status PENDING    CULTURE, BLOOD (ROUTINE X 2)     Status: None   Collection Time    03/10/14  8:50 PM      Result Value Ref Range   Specimen Description BLOOD LEFT HAND     Special Requests BOTTLES DRAWN AEROBIC ONLY [place]     Culture  Setup Time       Value: 03/10/2014 23:56     Performed at [facility]   Culture       Value:        BLOOD CULTURE RECEIVED NO GROWTH TO DATE CULTURE WILL BE HELD FOR 5 DAYS BEFORE ISSUING A FINAL NEGATIVE REPORT     Performed at [facility]   Report Status PENDING    CBC WITH DIFFERENTIAL     Status: Abnormal   Collection Time    03/10/14  8:50 PM      Result Value Ref Range   WBC 20.0 (*)  4.0 - 10.5 K/uL   RBC 4.72  4.22 - 5.81 MIL/uL   Hemoglobin 14.4  13.0 - 17.0 g/dL   HCT 40.6  39.0 - 52.0 %   MCV 86.0  78.0 - 100.0 fL   MCH 30.5  26.0 - 34.0 pg   MCHC 35.5  30.0 - 36.0 g/dL   RDW 12.7  11.5 - 15.5 %   Platelets 336  150 - 400 K/uL   Neutrophils Relative % 83 (*) 43 - 77 %   Neutro Abs 16.7 (*) 1.7 - 7.7 K/uL   Lymphocytes Relative 12  12 - 46 %   Lymphs Abs 2.4  0.7 - 4.0 K/uL   Monocytes Relative 4  3 - 12 %   Monocytes Absolute 0.9  0.1 - 1.0 K/uL   Eosinophils Relative 0  0 - 5 %   Eosinophils Absolute 0.0  0.0 - 0.7 K/uL   Basophils Relative 0  0 - 1 %   Basophils Absolute 0.0  0.0 - 0.1 K/uL  COMPREHENSIVE METABOLIC PANEL     Status: Abnormal   Collection Time    03/10/14  8:50 PM      Result Value Ref Range   Sodium 132 (*) 137 - 147 mEq/L   Potassium 3.9  3.7 - 5.3 mEq/L   Chloride 96  96 - 112 mEq/L   CO2 14 (*) 19 - 32 mEq/L   Glucose, Bld 322 (*) 70 - 99 mg/dL   BUN 9  6 - 23 mg/dL   Creatinine, Ser 0.59  0.50 - 1.35 mg/dL    Calcium 9.2  8.4 - 10.5 mg/dL   Total Protein 6.9  6.0 - 8.3 g/dL   Albumin 2.5 (*) 3.5 - 5.2 g/dL   AST 13  0 - 37 U/L   ALT 22  0 - 53 U/L   Alkaline Phosphatase 81  39 - 117 U/L   Total Bilirubin 0.5  0.3 - 1.2 mg/dL   GFR calc non Af Amer >90  >90 mL/min   GFR calc Af Amer >90  >90 mL/min   Comment: (NOTE)     The eGFR has been calculated using the CKD EPI equation.     This calculation has not been validated in all clinical situations.     eGFR's persistently <90 mL/min signify possible Chronic Kidney     Disease.  PROTIME-INR     Status: None   Collection Time    03/10/14 10:10 PM      Result Value Ref Range   Prothrombin Time 14.8  11.6 - 15.2 seconds   INR 1.19  0.00 - 1.49  APTT     Status: None   Collection Time    03/10/14 10:10 PM      Result Value Ref Range   aPTT 35  24 - 37 seconds  CBC     Status: Abnormal   Collection Time    03/11/14  4:40 AM      Result Value Ref Range   WBC 21.1 (*) 4.0 - 10.5 K/uL   RBC 4.95  4.22 - 5.81 MIL/uL   Hemoglobin 15.2  13.0 - 17.0 g/dL   HCT 43.2  39.0 - 52.0 %   MCV 87.3  78.0 - 100.0 fL   MCH 30.7  26.0 - 34.0 pg   MCHC 35.2  30.0 - 36.0 g/dL   RDW 12.9  11.5 - 15.5 %   Platelets 354  150 - 400 K/uL  COMPREHENSIVE METABOLIC PANEL     Status: Abnormal   Collection Time    03/11/14  4:40 AM      Result Value Ref Range   Sodium 134 (*) 137 - 147 mEq/L   Potassium 4.2  3.7 - 5.3 mEq/L   Chloride 96  96 - 112 mEq/L   CO2 14 (*) 19 - 32 mEq/L   Glucose, Bld 295 (*) 70 - 99 mg/dL   BUN 9  6 - 23 mg/dL   Creatinine, Ser 0.69  0.50 - 1.35 mg/dL   Calcium 9.5  8.4 - 10.5 mg/dL   Total Protein 7.1  6.0 - 8.3 g/dL   Albumin 2.6 (*) 3.5 - 5.2 g/dL   AST 12  0 - 37 U/L   ALT 22  0 - 53 U/L   Alkaline Phosphatase 88  39 - 117 U/L   Total Bilirubin 0.5  0.3 - 1.2 mg/dL   GFR calc non Af Amer >90  >90 mL/min   GFR calc Af Amer >90  >90 mL/min   Comment: (NOTE)     The eGFR has been calculated using the CKD EPI equation.      This calculation has not been validated in all clinical situations.     eGFR's persistently <90 mL/min signify possible Chronic Kidney     Disease.  GLUCOSE, CAPILLARY     Status: Abnormal   Collection Time    03/11/14 11:24 AM      Result Value Ref Range   Glucose-Capillary 275 (*) 70 - 99 mg/dL   Comment 1 Notify RN      Dg Chest 1 View  03/09/2014   CLINICAL DATA:  Status post right-sided thoracentesis  EXAM: CHEST - 1 VIEW  COMPARISON:  PA and lateral chest x-ray dated March 08, 2014  FINDINGS: There remains volume loss on the right little changed from yesterday's study. There is no evidence of a pneumothorax. The left lung is adequately inflated and clear. The cardiac silhouette where visualized appears mildly enlarged. The pulmonary vascularity on the left is normal. The is obscured on the right.  IMPRESSION: There is no evidence of a postprocedure pneumothorax following the right thoracentesis. The degree of volume loss on the right is little changed since yesterday's study.   Electronically Signed   By: David  Martinique   On: 03/09/2014 14:24   Ct Angio Chest Pe W/cm &/or Wo Cm  03/10/2014   CLINICAL DATA:  Short of breath and cough.  Recent pneumonia  EXAM: CT ANGIOGRAPHY CHEST WITH CONTRAST  TECHNIQUE: Multidetector CT imaging of the chest was performed using the standard protocol during bolus administration of intravenous contrast. Multiplanar CT image reconstructions and MIPs were obtained to evaluate the vascular anatomy.  CONTRAST:  67m OMNIPAQUE IOHEXOL 350 MG/ML SOLN  COMPARISON:  CXR 03/09/2014  FINDINGS: Negative for pulmonary embolism.  Negative for aortic dissection or aneurysm. Heart size is normal. Mild coronary artery calcification noted. Negative for pericardial effusion.  Large loculated right pleural effusion, suspicious for empyema. Compressive atelectasis in the right lower lobe.  Negative for infiltrate or effusion in the left chest.  Fatty infiltration of the liver with no  focal liver lesion identified. Spleen is normal in size.  Review of the MIP images confirms the above findings.  IMPRESSION: Negative for pulmonary embolism.  Large loculated right pleural effusion, worrisome for empyema. There is compressive atelectasis in the right lower lobe.  Fatty liver   Electronically Signed   By: CJuanda Crumble  Carlis Abbott M.D.   On: 03/10/2014 12:38   US Thoracentesis Asp Pleural Space W/img Guide  03/09/2014   CLINICAL DATA:  Pneumonia, cough, right pleural effusion. Request is made for diagnostic/therapeutic right thoracentesis  EXAM: ULTRASOUND GUIDED DIAGNOSTIC RIGHT THORACENTESIS  COMPARISON:  None.  PROCEDURE: An ultrasound guided thoracentesis was thoroughly discussed with the patient and questions answered. The benefits, risks, alternatives and complications were also discussed. The patient understands and wishes to proceed with the procedure. Written consent was obtained.  Ultrasound was performed to localize and mark an adequate pocket of fluid in the right chest. The area was then prepped and draped in the normal sterile fashion. 1% Lidocaine was used for local anesthesia. Under ultrasound guidance a 19 gauge Yueh catheter was introduced. Thoracentesis was performed. The catheter was removed and a dressing applied.  Complications:  None.  FINDINGS: A total of approximately 140 cc's of turbid, yellow fluid was removed. The fluid sample wassent for laboratory analysis. The pleural fluid collection was multiloculated in nature. Only the above amount of fluid could be removed at this time.  IMPRESSION: Successful ultrasound guided diagnostic right thoracentesis yielding 140 cc's of pleural fluid. Pleural fluid collection appears multiloculated on ultrasound imaging. Recommend follow-up CT chest for further evaluation.  Read by: Rowe Robert ,P.A.-C.   Electronically Signed   By: Daryll Brod M.D.   On: 03/09/2014 14:43    Review of Systems  Constitutional: Positive for malaise/fatigue.  Negative for fever, chills and diaphoresis.  Respiratory: Positive for cough and shortness of breath.   Cardiovascular: Positive for chest pain (right sided, pleuritic).  All other systems reviewed and are negative.   Blood pressure 165/92, pulse 123, temperature 98.7 F (37.1 C), temperature source Oral, resp. rate 18, height _0  (1.651 m), weight 219 lb 2.2 oz (99.4 kg), SpO2 96.00%. Physical Exam  Vitals reviewed. Constitutional: He is oriented to person, place, and time. He appears well-developed and well-nourished. No distress.  HENT:  Head: Normocephalic and atraumatic.  Eyes: EOM are normal. Pupils are equal, round, and reactive to light.  Neck: Neck supple. No thyromegaly present.  Cardiovascular: Normal rate, regular rhythm, normal heart sounds and intact distal pulses.  Exam reveals no friction rub.   No murmur heard. Respiratory: Effort normal.  Absent BS right lower 2/3 of chest  GI: Soft. There is no tenderness.  Musculoskeletal: He exhibits no edema.  Lymphadenopathy:    He has no cervical adenopathy.  Neurological: He is alert and oriented to person, place, and time. No cranial nerve deficit.  No focal deficit  Skin: Skin is warm and dry.    Assessment/Plan: 43 yo diabetic with a loculated right pleural effusion. This is most likely a para-pneumonic effusion, if not an early organizing empyema. It will require surgical drainage.  He has been started on appropriate IV antibiotics with zosyn and vancomycin  I reviewed the CT with Mr. Mccrystal and his family. I explained the nature of the effusion and the reasons why surgical intervention was necessary. I discussed with them the general nature of the procedure, the need for general anesthesia, and the incisions to be used. We discussed the expected hospital stay, overall recovery and short and long term outcomes. They understand the risks include, but are not limited to death, stroke, MI, DVT/PE, bleeding, possible need  for transfusion, infections, air leaks, and other organ system dysfunction. He accepts the risks of surgery and wishes to proceed.  Will see if we can schedule in OR for  tomorrow  Melrose Nakayama 03/11/2014, 12:41 PM

## 2014-03-12 NOTE — Transfer of Care (Signed)
Immediate Anesthesia Transfer of Care Note  Patient: Dean Wallace  Procedure(s) Performed: Procedure(s): VIDEO ASSISTED THORACOSCOPY (VATS)/EMPYEMA (Right) DECORTICATION (Right)  Patient Location: PACU  Anesthesia Type:General  Level of Consciousness: sedated  Airway & Oxygen Therapy: Patient Spontanous Breathing and Patient connected to face mask oxygen  Post-op Assessment: Report given to PACU RN and Post -op Vital signs reviewed and stable  Post vital signs: Reviewed and stable  Complications: No apparent anesthesia complications

## 2014-03-12 NOTE — Interval H&P Note (Signed)
History and Physical Interval Note:  03/12/2014 8:52 AM  Dean Wallace  has presented today for surgery, with the diagnosis of empyema  The various methods of treatment have been discussed with the patient and family. After consideration of risks, benefits and other options for treatment, the patient has consented to  Procedure(s): VIDEO ASSISTED THORACOSCOPY (VATS)/EMPYEMA (Right) as a surgical intervention .  The patient's history has been reviewed, patient examined, no change in status, stable for surgery.  I have reviewed the patient's chart and labs.  Questions were answered to the patient's satisfaction.     [name]

## 2014-03-12 NOTE — CV Procedure (Signed)
Right  VAT

## 2014-03-12 NOTE — Progress Notes (Signed)
PROGRESS NOTE  Dean Wallace GSU:[id number] DOB: [birth year] DOA: 03/10/2014 PCP: Crisoforo Oxford, PA-C  Assessment/Plan: Empyema of the right pleural space  - VATS today per Dr. Roxan Hockey - continue antibiotics vancomycin and Zosyn.  - pleural fluid cultures pending, no growth so far.  Diabetes mellitus  - A1C shows DM out of control, started Lantus last night, continue sliding scale. He is NPO now for surgery, may need insulin gtt post op. Further titrate insulin.  HTN - monitor for now, will need probably an ACEI post op.  Leukocytosis - due to #1  Diet: NPO Fluids: sodium bicarb DVT Prophylaxis: lovenox  Code Status: Full Family Communication: d/w patient and family Disposition Plan: inpatient  Consultants:  Dr. Roxan Hockey surgery   Procedures:  none   Antibiotics Vancomycin 3/27 >> Zosyn 3/27 >>  HPI/Subjective: - still dyspneic  Objective: Filed Vitals:   03/11/14 0442 03/11/14 1827 03/11/14 1930 03/12/14 0447  BP: 165/92 154/86 173/98 158/93  Pulse: 123 119 120 116  Temp: 98.7 F (37.1 C) 98.3 F (36.8 C) 97.5 F (36.4 C) 97 F (36.1 C)  TempSrc: Oral Axillary Oral Oral  Resp: 18 20 20 18   Height:      Weight:    96 kg (211 lb 10.3 oz)  SpO2: 96% 98% 97% 98%    Intake/Output Summary (Last 24 hours) at 03/12/14 0945 Last data filed at 03/12/14 0700  Gross per 24 hour  Intake      0 ml  Output      0 ml  Net      0 ml   Filed Weights   03/10/14 1631 03/12/14 0447  Weight: 99.4 kg (219 lb 2.2 oz) 96 kg (211 lb 10.3 oz)    Exam:  General:  NAD  Cardiovascular: regular rate and rhythm, without MRG, tachycardic  Respiratory: good air movement, no wheezing, decreased breath sounds on right lower base  Abdomen: soft, not tender to palpation, positive bowel sounds  MSK: no peripheral edema  Neuro: CN 2-12 grossly intact, MS 5/5 in all 4  Data Reviewed: Basic Metabolic Panel:  Recent Labs Lab 03/10/14 2050 03/11/14 0440  03/11/14 1600 03/11/14 2141 03/12/14 0345  NA 132* 134* 131* 131* 133*  K 3.9 4.2 4.2 4.4 4.2  CL 96 96 94* 95* 96  CO2 14* 14* 13* 15* 15*  GLUCOSE 322* 295* 348* 350* 323*  BUN 9 9 10 10 11   CREATININE 0.59 0.69 0.63 0.63 0.62  CALCIUM 9.2 9.5 10.0 9.7 9.7   Liver Function Tests:  Recent Labs Lab 03/10/14 2050 03/11/14 0440 03/11/14 1600  AST 13 12 13   ALT 22 22 25   ALKPHOS 81 88 96  BILITOT 0.5 0.5 0.5  PROT 6.9 7.1 7.7  ALBUMIN 2.5* 2.6* 2.6*   CBC:  Recent Labs Lab 03/08/14 1206 03/10/14 2050 03/11/14 0440 03/11/14 1600 03/12/14 0345  WBC 20.2* 20.0* 21.1* 24.1* 21.0*  NEUTROABS  --  16.7*  --   --  17.9*  HGB 14.8 14.4 15.2 15.7 15.2  HCT 43.1 40.6 43.2 44.7 43.0  MCV 85.7 86.0 87.3 87.3 87.2  PLT 329 336 354 412* 354   Recent Results (from the past 240 hour(s))  BODY FLUID CULTURE     Status: None   Collection Time    03/09/14  2:24 PM      Result Value Ref Range Status   Specimen Description PLEURAL   Final   Special Requests NONE   Final  Gram Stain     Final   Value: FEW WBC PRESENT,BOTH PMN AND MONONUCLEAR     NO ORGANISMS SEEN     Performed at [facility]   Culture     Final   Value: MODERATE STREPTOCOCCUS SPECIES     Performed at [facility]   Report Status PENDING   Incomplete  CULTURE, BLOOD (ROUTINE X 2)     Status: None   Collection Time    03/10/14  8:40 PM      Result Value Ref Range Status   Specimen Description BLOOD RIGHT ARM   Final   Special Requests BOTTLES DRAWN AEROBIC ONLY 5CC   Final   Culture  Setup Time     Final   Value: 03/10/2014 23:55     Performed at [facility]   Culture     Final   Value:        BLOOD CULTURE RECEIVED NO GROWTH TO DATE CULTURE WILL BE HELD FOR 5 DAYS BEFORE ISSUING A FINAL NEGATIVE REPORT     Performed at [facility]   Report Status PENDING   Incomplete  CULTURE, BLOOD (ROUTINE X 2)     Status: None   Collection Time    03/10/14  8:50 PM      Result  Value Ref Range Status   Specimen Description BLOOD LEFT HAND   Final   Special Requests BOTTLES DRAWN AEROBIC ONLY [place]   Final   Culture  Setup Time     Final   Value: 03/10/2014 23:56     Performed at [facility]   Culture     Final   Value:        BLOOD CULTURE RECEIVED NO GROWTH TO DATE CULTURE WILL BE HELD FOR 5 DAYS BEFORE ISSUING A FINAL NEGATIVE REPORT     Performed at [facility]   Report Status PENDING   Incomplete  SURGICAL PCR SCREEN     Status: None   Collection Time    03/12/14  4:01 AM      Result Value Ref Range Status   MRSA, PCR NEGATIVE  NEGATIVE Final   Staphylococcus aureus NEGATIVE  NEGATIVE Final   Comment:            The Xpert SA Assay (FDA     approved for NASAL specimens     in patients over 42 years of age),     is one component of     a comprehensive surveillance     program.  Test performance has     been validated by [employer] for patients greater     than or equal to 42 year old.     It is not intended     to diagnose infection nor to     guide or monitor treatment.   Studies: Chest 2 View  03/11/2014   CLINICAL DATA:  Preop.  EXAM: CHEST  2 VIEW  FINDINGS: Severe right lung airspace disease and right effusion again noted, not significantly changed. Heart is mildly enlarged. Left lung is clear. Low lung volumes. No acute bony abnormality.  IMPRESSION: No significant change in the right lung opacity in right effusion.   Electronically Signed   By: Rolm Baptise M.D.   On: 03/11/2014 15:16   Ct Angio Chest Pe W/cm &/or Wo Cm  03/10/2014   CLINICAL DATA:  Short of breath and cough.  Recent pneumonia  EXAM: CT ANGIOGRAPHY CHEST WITH CONTRAST  TECHNIQUE: Multidetector CT imaging of the chest was performed using the standard protocol during bolus administration of intravenous contrast. Multiplanar CT image reconstructions and MIPs were obtained to evaluate the vascular anatomy.  CONTRAST:  64m OMNIPAQUE IOHEXOL 350 MG/ML SOLN   COMPARISON:  CXR 03/09/2014  FINDINGS: Negative for pulmonary embolism.  Negative for aortic dissection or aneurysm. Heart size is normal. Mild coronary artery calcification noted. Negative for pericardial effusion.  Large loculated right pleural effusion, suspicious for empyema. Compressive atelectasis in the right lower lobe.  Negative for infiltrate or effusion in the left chest.  Fatty infiltration of the liver with no focal liver lesion identified. Spleen is normal in size.  Review of the MIP images confirms the above findings.  IMPRESSION: Negative for pulmonary embolism.  Large loculated right pleural effusion, worrisome for empyema. There is compressive atelectasis in the right lower lobe.  Fatty liver   Electronically Signed   By: CFranchot GalloM.D.   On: 03/10/2014 12:38   Scheduled Meds: . [MAR HOLD] enoxaparin (LOVENOX) injection  40 mg Subcutaneous Q24H  . [MAR HOLD] insulin aspart  0-15 Units Subcutaneous TID WC  . [MAR HOLD] insulin aspart  0-5 Units Subcutaneous QHS  . [MAR HOLD] piperacillin-tazobactam (ZOSYN)  IV  3.375 g Intravenous Q8H  . [facility] vancomycin  1,000 mg Intravenous Q8H   Continuous Infusions: . dextrose 5 % 1,000 mL with sodium bicarbonate 150 mEq infusion 50 mL/hr at 03/11/14 1901    Active Problems:   Empyema of right pleural space   DM (diabetes mellitus)  Time spent: 25  This note has been created with DSurveyor, quantity Any transcriptional errors are unintentional.   CMarzetta Board MD [facility] Pager [phone] If 7 PM - 7 AM, please contact night-coverage at [email or web address], password [email or web address]3/29/2015, 9:45 AM  LOS: 2 days

## 2014-03-12 NOTE — Anesthesia Procedure Notes (Signed)
Procedure Name: Intubation Date/Time: 03/12/2014 9:38 AM Performed by: Marinda Elk A Pre-anesthesia Checklist: Patient identified, Timeout performed, Emergency Drugs available, Suction available and Patient being monitored Patient Re-evaluated:Patient Re-evaluated prior to inductionOxygen Delivery Method: Circle system utilized Preoxygenation: Pre-oxygenation with 100% oxygen Intubation Type: IV induction Ventilation: Mask ventilation without difficulty Endobronchial tube: Left, EBT position confirmed by auscultation and EBT position confirmed by fiberoptic bronchoscope and 39 Fr Number of attempts: 1 Airway Equipment and Method: Video-laryngoscopy Placement Confirmation: ETT inserted through vocal cords under direct vision,  positive ETCO2 and breath sounds checked- equal and bilateral Secured at: 29 cm Tube secured with: Tape Dental Injury: Teeth and Oropharynx as per pre-operative assessment

## 2014-03-12 NOTE — Anesthesia Postprocedure Evaluation (Signed)
Anesthesia Post Note  Patient: Dean Wallace  Procedure(s) Performed: Procedure(s) (LRB): VIDEO ASSISTED THORACOSCOPY (VATS)/EMPYEMA (Right) DECORTICATION (Right)  Anesthesia type: General  Patient location: PACU  Post pain: Pain level controlled  Post assessment: Patient's Cardiovascular Status Stable  Last Vitals:  Filed Vitals:   03/12/14 1315  BP: 144/81  Pulse: 133  Temp:   Resp: 17    Post vital signs: Reviewed and stable  Level of consciousness: alert  Complications: No apparent anesthesia complications

## 2014-03-12 NOTE — Preoperative (Signed)
Beta Blockers   Reason not to administer Beta Blockers:Not Applicable 

## 2014-03-12 NOTE — Anesthesia Preprocedure Evaluation (Addendum)
Anesthesia Evaluation  Patient identified by MRN, date of birth, ID band Patient awake    Reviewed: Allergy & Precautions, H&P , NPO status , Patient's Chart, lab work & pertinent test results, reviewed documented beta blocker date and time   Airway Mallampati: II TM Distance: >3 FB Neck ROM: full    Dental  (+) Dental Advisory Given, Teeth Intact   Pulmonary shortness of breath and at rest, pneumonia -, unresolved, Recent URI ,  tachypneic - 30's / min   + decreased breath sounds      Cardiovascular negative cardio ROS  Rhythm:regular     Neuro/Psych negative neurological ROS  negative psych ROS   GI/Hepatic negative GI ROS, Neg liver ROS,   Endo/Other  diabetes, Insulin Dependent  Renal/GU negative Renal ROS  negative genitourinary   Musculoskeletal   Abdominal   Peds  Hematology negative hematology ROS (+)   Anesthesia Other Findings See surgeon's H&P  Discussed poss. Post-op ventilation  Reproductive/Obstetrics negative OB ROS                         Anesthesia Physical Anesthesia Plan  ASA: III  Anesthesia Plan: General   Post-op Pain Management:    Induction: Intravenous  Airway Management Planned: Double Lumen EBT  Additional Equipment: Arterial line, CVP and Ultrasound Guidance Line Placement  Intra-op Plan:   Post-operative Plan: Extubation in OR  Informed Consent: I have reviewed the patients History and Physical, chart, labs and discussed the procedure including the risks, benefits and alternatives for the proposed anesthesia with the patient or authorized representative who has indicated his/her understanding and acceptance.   Dental Advisory Given  Plan Discussed with: CRNA and Surgeon  Anesthesia Plan Comments:         Anesthesia Quick Evaluation

## 2014-03-13 ENCOUNTER — Inpatient Hospital Stay (HOSPITAL_COMMUNITY): Payer: 59

## 2014-03-13 LAB — CBC
HEMATOCRIT: 33.3 % — AB (ref 39.0–52.0)
Hemoglobin: 11.5 g/dL — ABNORMAL LOW (ref 13.0–17.0)
MCH: 30 pg (ref 26.0–34.0)
MCHC: 34.5 g/dL (ref 30.0–36.0)
MCV: 86.9 fL (ref 78.0–100.0)
Platelets: 292 10*3/uL (ref 150–400)
RBC: 3.83 MIL/uL — AB (ref 4.22–5.81)
RDW: 13.4 % (ref 11.5–15.5)
WBC: 19.7 10*3/uL — ABNORMAL HIGH (ref 4.0–10.5)

## 2014-03-13 LAB — BASIC METABOLIC PANEL
BUN: 13 mg/dL (ref 6–23)
CALCIUM: 8 mg/dL — AB (ref 8.4–10.5)
CHLORIDE: 107 meq/L (ref 96–112)
CO2: 20 mEq/L (ref 19–32)
Creatinine, Ser: 0.65 mg/dL (ref 0.50–1.35)
GFR calc Af Amer: >90 mL/min (ref >90)
GFR calc non Af Amer: >90 mL/min (ref >90)
Glucose, Bld: 165 mg/dL — ABNORMAL HIGH (ref 70–99)
Potassium: 3.6 mEq/L — ABNORMAL LOW (ref 3.7–5.3)
Sodium: 139 mEq/L (ref 137–147)

## 2014-03-13 LAB — VANCOMYCIN, TROUGH: Vancomycin Tr: 6.7 ug/mL — ABNORMAL LOW (ref 10.0–20.0)

## 2014-03-13 LAB — BLOOD GAS, ARTERIAL
Acid-base deficit: 4.2 mmol/L — ABNORMAL HIGH (ref 0.0–2.0)
BICARBONATE: 20.4 meq/L (ref 20.0–24.0)
Drawn by: 33234
O2 Content: 2 L/min
O2 Saturation: 96.8 %
PH ART: 7.354 (ref 7.350–7.450)
PO2 ART: 80.1 mmHg (ref 80.0–100.0)
Patient temperature: 98.6
TCO2: 21.5 mmol/L (ref 0–100)
pCO2 arterial: 37.5 mmHg (ref 35.0–45.0)

## 2014-03-13 LAB — GLUCOSE, CAPILLARY
GLUCOSE-CAPILLARY: 159 mg/dL — AB (ref 70–99)
GLUCOSE-CAPILLARY: 180 mg/dL — AB (ref 70–99)
Glucose-Capillary: 131 mg/dL — ABNORMAL HIGH (ref 70–99)
Glucose-Capillary: 138 mg/dL — ABNORMAL HIGH (ref 70–99)
Glucose-Capillary: 138 mg/dL — ABNORMAL HIGH (ref 70–99)
Glucose-Capillary: 140 mg/dL — ABNORMAL HIGH (ref 70–99)
Glucose-Capillary: 149 mg/dL — ABNORMAL HIGH (ref 70–99)
Glucose-Capillary: 149 mg/dL — ABNORMAL HIGH (ref 70–99)
Glucose-Capillary: 152 mg/dL — ABNORMAL HIGH (ref 70–99)
Glucose-Capillary: 155 mg/dL — ABNORMAL HIGH (ref 70–99)
Glucose-Capillary: 165 mg/dL — ABNORMAL HIGH (ref 70–99)
Glucose-Capillary: 183 mg/dL — ABNORMAL HIGH (ref 70–99)

## 2014-03-13 MED ORDER — LIVING WELL WITH DIABETES BOOK
Freq: Once | Status: AC
  Administered 2014-03-13: 16:00:00
  Filled 2014-03-13: qty 1

## 2014-03-13 MED ORDER — INSULIN ASPART 100 UNIT/ML ~~LOC~~ SOLN
3.0000 [IU] | Freq: Three times a day (TID) | SUBCUTANEOUS | Status: DC
  Administered 2014-03-13 – 2014-03-14 (×6): 3 [IU] via SUBCUTANEOUS

## 2014-03-13 MED ORDER — GUAIFENESIN ER 600 MG PO TB12
1200.0000 mg | ORAL_TABLET | Freq: Two times a day (BID) | ORAL | Status: DC | PRN
  Filled 2014-03-13: qty 2

## 2014-03-13 MED ORDER — INSULIN DETEMIR 100 UNIT/ML ~~LOC~~ SOLN
25.0000 [IU] | Freq: Two times a day (BID) | SUBCUTANEOUS | Status: DC
  Administered 2014-03-13 – 2014-03-15 (×5): 25 [IU] via SUBCUTANEOUS
  Filled 2014-03-13 (×6): qty 0.25

## 2014-03-13 MED ORDER — INSULIN ASPART 100 UNIT/ML ~~LOC~~ SOLN
0.0000 [IU] | Freq: Three times a day (TID) | SUBCUTANEOUS | Status: DC
  Administered 2014-03-13 (×2): 2 [IU] via SUBCUTANEOUS
  Administered 2014-03-13 – 2014-03-14 (×2): 3 [IU] via SUBCUTANEOUS
  Administered 2014-03-14 (×2): 2 [IU] via SUBCUTANEOUS
  Administered 2014-03-15: 5 [IU] via SUBCUTANEOUS
  Administered 2014-03-15: 2 [IU] via SUBCUTANEOUS
  Administered 2014-03-16: 5 [IU] via SUBCUTANEOUS
  Administered 2014-03-16 (×2): 2 [IU] via SUBCUTANEOUS
  Administered 2014-03-17: 3 [IU] via SUBCUTANEOUS

## 2014-03-13 MED ORDER — VANCOMYCIN HCL 10 G IV SOLR
1750.0000 mg | Freq: Three times a day (TID) | INTRAVENOUS | Status: DC
  Administered 2014-03-13 – 2014-03-15 (×6): 1750 mg via INTRAVENOUS
  Filled 2014-03-13 (×9): qty 1750

## 2014-03-13 NOTE — Progress Notes (Signed)
1 Day Post-Op Procedure(s) (LRB): VIDEO ASSISTED THORACOSCOPY (VATS)/EMPYEMA (Right) DECORTICATION (Right) Subjective: Some pain, but breathing better than preop  Objective: Vital signs in last 24 hours: Temp:  [97.5 F (36.4 C)-98.4 F (36.9 C)] 97.9 F (36.6 C) (03/30 0719) Pulse Rate:  [96-137] 102 (03/30 0700) Cardiac Rhythm:  [-] Sinus tachycardia (03/30 0600) Resp:  [16-29] 21 (03/30 0700) BP: (100-158)/(54-93) 113/65 mmHg (03/30 0700) SpO2:  [92 %-99 %] 97 % (03/30 0700) Arterial Line BP: (105-153)/(64-86) 109/64 mmHg (03/29 1430) Weight:  [218 lb 7.6 oz (99.1 kg)] 218 lb 7.6 oz (99.1 kg) (03/30 0600)  Hemodynamic parameters for last 24 hours:    Intake/Output from previous day: 03/29 0701 - 03/30 0700 In: 6132.1 [P.O.:1040; I.V.:3042.1; IV Piggyback:2050] Out: 2480 [Urine:1890; Blood:100; Chest Tube:490] Intake/Output this shift:    General appearance: alert and no distress Neurologic: intact Heart: regular rate and rhythm Lungs: diminished breath sounds bibasilar Abdomen: normal findings: soft, non-tender no air leak  Lab Results:  Recent Labs  03/12/14 0345 03/13/14 0400  WBC 21.0* 19.7*  HGB 15.2 11.5*  HCT 43.0 33.3*  PLT 354 292   BMET:  Recent Labs  03/12/14 0345  03/12/14 1134 03/13/14 0400  NA 133*  --   --  139  K 4.2  --   --  3.6*  CL 96  --   --  107  CO2 15*  --   --  20  GLUCOSE 323*  < > 304* 165*  BUN 11  --   --  13  CREATININE 0.62  --   --  0.65  CALCIUM 9.7  --   --  8.0*  < > = values in this interval not displayed.  PT/INR:  Recent Labs  03/11/14 1600  LABPROT 15.2  INR 1.23   ABG    Component Value Date/Time   PHART 7.354 03/13/2014 0430   HCO3 20.4 03/13/2014 0430   TCO2 21.5 03/13/2014 0430   ACIDBASEDEF 4.2* 03/13/2014 0430   O2SAT 96.8 03/13/2014 0430   CBG (last 3)   Recent Labs  03/12/14 2200 03/12/14 2301 03/13/14 0006  GLUCAP 183* 149* 131*    Assessment/Plan: S/P Procedure(s) (LRB): VIDEO  ASSISTED THORACOSCOPY (VATS)/EMPYEMA (Right) DECORTICATION (Right) Plan for transfer to step-down: see transfer orders POD # 1 decortication  He still has some right basilar atelectasis, but moving air better  Continue IS, add flutter valve  No air leak- dc anterior CT, leave remaining tubes until drainage down  ID- on Vanco and zosyn for pneumonia complicated by empyema  Anemia secondary to ABL- mild, follow  Diabetes- on insulin drip- transition to levemir + SSI  SCD for DVT prophylaxis- add enoxaparin  OOB, ambulate   LOS: 3 days    Dean Wallace C 03/13/2014

## 2014-03-13 NOTE — Progress Notes (Signed)
Inpatient Diabetes Program Recommendations  AACE/ADA: New Consensus Statement on Inpatient Glycemic Control (2013)  Target Ranges:  Prepandial:   less than 140 mg/dL      Peak postprandial:   less than 180 mg/dL (1-2 hours)      Critically ill patients:  140 - 180 mg/dL   Reason for Visit: Elevated A1C=12.4%. Discussed with patient. He states that he has been diet controlled in the past however recently CBG's have been elevated.  He is seeing a new MD in [place], Dr. Glade Lloyd.  Discussed A1C results and he is aware that he will need insulin at discharge.  Patient currently still in ICU.  Will follow up this week to show him insulin pens and to order diabetes videos, booklet, etc.  Adah Perl, RN, BC-ADM Inpatient Diabetes Coordinator Pager [phone]

## 2014-03-13 NOTE — Progress Notes (Signed)
Patient ID: Dean Wallace, male   DOB: [birth year], 43 y.o.   MRN: [id number]  SICU Evening Rounds:  Hemodynamically stable  sats 95%  Urine output adequate CT output low.  CBG 180 this afternoon. On Levemir bid, SSI and meal coverage.  Overall had a stable day. Awaiting transfer to stepdown.

## 2014-03-13 NOTE — Progress Notes (Signed)
PROGRESS NOTE  Dean Wallace XBL:[id number] DOB: [birth year] DOA: 03/10/2014 PCP: Crisoforo Oxford, PA-C  Assessment/Plan: Diabetes mellitus  - A1C shows DM out of control, on gtt post op - Levemir and SSI. Patient hungry and wants to eat. Empyema of the right pleural space - per vascular, POD #1 HTN - monitor for now, will need probably an ACEI post op. BP better post op.  Leukocytosis - due to #1, improving.   Diet: NPO Fluids: sodium bicarb DVT Prophylaxis: lovenox  Code Status: Full Family Communication: d/w patient and family Disposition Plan: inpatient  Consultants:  Dr. Roxan Hockey surgery   Procedures:  none   Antibiotics Vancomycin 3/27 >> Zosyn 3/27 >>  HPI/Subjective: - breathing better post op.   Objective: Filed Vitals:   03/13/14 0800 03/13/14 0900 03/13/14 1000 03/13/14 1100  BP: 120/62 118/64 137/76 128/67  Pulse: 97 98 112 105  Temp:      TempSrc:      Resp: 21 21 29 21   Height:      Weight:      SpO2: 97% 98% 99% 97%    Intake/Output Summary (Last 24 hours) at 03/13/14 1118 Last data filed at 03/13/14 1100  Gross per 24 hour  Intake 6682.45 ml  Output   2405 ml  Net 4277.45 ml   Filed Weights   03/10/14 1631 03/12/14 0447 03/13/14 0600  Weight: 99.4 kg (219 lb 2.2 oz) 96 kg (211 lb 10.3 oz) 99.1 kg (218 lb 7.6 oz)    Exam:  General:  NAD  Cardiovascular: regular rate and rhythm, without MRG, tachycardic  Respiratory: good air movement, no wheezing  Abdomen: soft, not tender to palpation, positive bowel sounds  MSK: no peripheral edema  Neuro: CN 2-12 grossly intact, MS 5/5 in all 4  Data Reviewed: Basic Metabolic Panel:  Recent Labs Lab 03/11/14 0440 03/11/14 1600 03/11/14 2141 03/12/14 0345 03/12/14 1017 03/12/14 1134 03/13/14 0400  NA 134* 131* 131* 133*  --   --  139  K 4.2 4.2 4.4 4.2  --   --  3.6*  CL 96 94* 95* 96  --   --  107  CO2 14* 13* 15* 15*  --   --  20  GLUCOSE 295* 348* 350* 323* 342*  304* 165*  BUN 9 10 10 11   --   --  13  CREATININE 0.69 0.63 0.63 0.62  --   --  0.65  CALCIUM 9.5 10.0 9.7 9.7  --   --  8.0*   Liver Function Tests:  Recent Labs Lab 03/10/14 2050 03/11/14 0440 03/11/14 1600  AST 13 12 13   ALT 22 22 25   ALKPHOS 81 88 96  BILITOT 0.5 0.5 0.5  PROT 6.9 7.1 7.7  ALBUMIN 2.5* 2.6* 2.6*   CBC:  Recent Labs Lab 03/08/14 1206 03/10/14 2050 03/11/14 0440 03/11/14 1600 03/12/14 0345 03/13/14 0400  WBC 20.2* 20.0* 21.1* 24.1* 21.0* 19.7*  NEUTROABS  --  16.7*  --   --  17.9*  --   HGB 14.8 14.4 15.2 15.7 15.2 11.5*  HCT 43.1 40.6 43.2 44.7 43.0 33.3*  MCV 85.7 86.0 87.3 87.3 87.2 86.9  PLT 329 336 354 412* 354 292   Recent Results (from the past 240 hour(s))  BODY FLUID CULTURE     Status: None   Collection Time    03/09/14  2:24 PM      Result Value Ref Range Status   Specimen Description PLEURAL   Final  Special Requests NONE   Final   Gram Stain     Final   Value: FEW WBC PRESENT,BOTH PMN AND MONONUCLEAR     NO ORGANISMS SEEN     Performed at [facility]   Culture     Final   Value: MODERATE STREPTOCOCCUS INTERMEDIUS     Performed at [facility]   Report Status 03/12/2014 FINAL   Final   Organism ID, Bacteria STREPTOCOCCUS INTERMEDIUS   Final  CULTURE, BLOOD (ROUTINE X 2)     Status: None   Collection Time    03/10/14  8:40 PM      Result Value Ref Range Status   Specimen Description BLOOD RIGHT ARM   Final   Special Requests BOTTLES DRAWN AEROBIC ONLY 5CC   Final   Culture  Setup Time     Final   Value: 03/10/2014 23:55     Performed at [facility]   Culture     Final   Value:        BLOOD CULTURE RECEIVED NO GROWTH TO DATE CULTURE WILL BE HELD FOR 5 DAYS BEFORE ISSUING A FINAL NEGATIVE REPORT     Performed at [facility]   Report Status PENDING   Incomplete  CULTURE, BLOOD (ROUTINE X 2)     Status: None   Collection Time    03/10/14  8:50 PM      Result Value Ref Range Status    Specimen Description BLOOD LEFT HAND   Final   Special Requests BOTTLES DRAWN AEROBIC ONLY [place]   Final   Culture  Setup Time     Final   Value: 03/10/2014 23:56     Performed at [facility]   Culture     Final   Value:        BLOOD CULTURE RECEIVED NO GROWTH TO DATE CULTURE WILL BE HELD FOR 5 DAYS BEFORE ISSUING A FINAL NEGATIVE REPORT     Performed at [facility]   Report Status PENDING   Incomplete  SURGICAL PCR SCREEN     Status: None   Collection Time    03/12/14  4:01 AM      Result Value Ref Range Status   MRSA, PCR NEGATIVE  NEGATIVE Final   Staphylococcus aureus NEGATIVE  NEGATIVE Final   Comment:            The Xpert SA Assay (FDA     approved for NASAL specimens     in patients over 31 years of age),     is one component of     a comprehensive surveillance     program.  Test performance has     been validated by [employer] for patients greater     than or equal to 20 year old.     It is not intended     to diagnose infection nor to     guide or monitor treatment.  BODY FLUID CULTURE     Status: None   Collection Time    03/12/14 10:14 AM      Result Value Ref Range Status   Specimen Description FLUID RIGHT PLEURAL   Final   Special Requests PATIENT ON FOLLOWING VANCO ADN ZOSYN NO1   Final   Gram Stain     Final   Value: WBC PRESENT, PREDOMINANTLY PMN     ABUNDANT GRAM POSITIVE COCCI IN PAIRS     IN CHAINS  ABUNDANT GRAM NEGATIVE COCCOBACILLI     Performed at [facility]     Final   Value: NO GROWTH 1 DAY     Performed at [facility]   Report Status PENDING   Incomplete  TISSUE CULTURE     Status: None   Collection Time    03/12/14 10:19 AM      Result Value Ref Range Status   Specimen Description TISSUE   Final   Special Requests     Final   Value: PATIENT ON FOLLOWING VANCO AND ZOSYN RIGHT PLEURAL PEEL   Gram Stain     Final   Value: FEW WBC PRESENT, PREDOMINANTLY PMN     RARE SQUAMOUS EPITHELIAL CELLS  PRESENT     NO ORGANISMS SEEN     Performed at [facility]   Culture     Final   Value: NO GROWTH 1 DAY     Performed at [facility]   Report Status PENDING   Incomplete  ANAEROBIC CULTURE     Status: None   Collection Time    03/12/14 10:19 AM      Result Value Ref Range Status   Specimen Description TISSUE   Final   Special Requests     Final   Value: PATIENT ON FOLLOWING VANCO AND ZOSYN RIGHT PLEURAL PEEL   Gram Stain     Final   Value: FEW WBC PRESENT, PREDOMINANTLY PMN     RARE SQUAMOUS EPITHELIAL CELLS PRESENT     NO ORGANISMS SEEN     Performed at [facility]   Culture PENDING   Incomplete   Report Status PENDING   Incomplete   Studies: Chest 2 View  03/11/2014   CLINICAL DATA:  Preop.  EXAM: CHEST  2 VIEW  FINDINGS: Severe right lung airspace disease and right effusion again noted, not significantly changed. Heart is mildly enlarged. Left lung is clear. Low lung volumes. No acute bony abnormality.  IMPRESSION: No significant change in the right lung opacity in right effusion.   Electronically Signed   By: Rolm Baptise M.D.   On: 03/11/2014 15:16   Dg Chest Port 1 View  03/13/2014   CLINICAL DATA:  Status post VATS for right empyema.  EXAM: PORTABLE CHEST - 1 VIEW  COMPARISON:  DG CHEST 1V PORT dated 03/12/2014; DG CHEST 2 VIEW dated 03/11/2014; CT ANGIO CHEST W/CM &/OR WO/CM dated 03/10/2014  FINDINGS: Stable positioning of 2 separate right chest tubes. No pneumothorax or significant reaccumulation of right pleural fluid is identified. There is stable significant volume loss of the right lung with marginally improved aeration. Aeration of the left lung has improved since prior study. Stable cardiomegaly.  IMPRESSION: Improved aeration. No right-sided pneumothorax or pleural fluid reaccumulation.   Electronically Signed   By: Aletta Edouard M.D.   On: 03/13/2014 07:18   Dg Chest Portable 1 View  03/12/2014   CLINICAL DATA:  Post VATS for empyema.  EXAM:  PORTABLE CHEST - 1 VIEW  COMPARISON:  03/11/2014  FINDINGS: There appears to be three right-sided chest tubes. There are patchy densities throughout the right lung. Overall, there are low lung volumes. Right jugular central line has been placed. Catheter tip in lower SVC. No evidence for a pneumothorax. Heart size is grossly stable.  IMPRESSION: Central venous catheter tip in the lower SVC.  Right chest tubes without a large pneumothorax.  Patchy densities throughout the right lung.   Electronically  Signed   By: Markus Daft M.D.   On: 03/12/2014 13:22   Scheduled Meds: . acetaminophen  1,000 mg Oral 4 times per day   Or  . acetaminophen (TYLENOL) oral liquid 160 mg/5 mL  1,000 mg Oral 4 times per day  . bisacodyl  10 mg Oral Daily  . enoxaparin (LOVENOX) injection  40 mg Subcutaneous Q24H  . fentaNYL   Intravenous 6 times per day  . insulin aspart  0-15 Units Subcutaneous TID WC  . insulin aspart  3 Units Subcutaneous TID WC  . insulin detemir  25 Units Subcutaneous BID  . ketorolac  30 mg Intravenous 4 times per day  . pantoprazole  40 mg Oral Daily  . piperacillin-tazobactam (ZOSYN)  IV  3.375 g Intravenous Q8H  . vancomycin  1,000 mg Intravenous Q8H   Continuous Infusions: . 0.9 % NaCl with KCl 20 mEq / L 50 mL/hr at 03/13/14 8333   Active Problems:   Empyema of right pleural space   DM (diabetes mellitus)   Loculated pleural effusion  Time spent: 25  This note has been created with Surveyor, quantity. Any transcriptional errors are unintentional.   Marzetta Board, MD [facility] Pager [phone]. If 7 PM - 7 AM, please contact night-coverage at [email or web address], password [facility] 03/13/2014, 11:18 AM  LOS: 3 days

## 2014-03-13 NOTE — Discharge Instructions (Addendum)
Diabetes and Exercise Exercising regularly is important. It is not just about losing weight. It has many health benefits, such as:  Improving your overall fitness, flexibility, and endurance.  Increasing your bone density.  Helping with weight control.  Decreasing your body fat.  Increasing your muscle strength.  Reducing stress and tension.  Improving your overall health. People with diabetes who exercise gain additional benefits because exercise:  Reduces appetite.  Improves the body's use of blood sugar (glucose).  Helps lower or control blood glucose.  Decreases blood pressure.  Helps control blood lipids (such as cholesterol and triglycerides).  Improves the body's use of the hormone insulin by:  Increasing the body's insulin sensitivity.  Reducing the body's insulin needs.  Decreases the risk for heart disease because exercising:  Lowers cholesterol and triglycerides levels.  Increases the levels of good cholesterol (such as high-density lipoproteins [HDL]) in the body.  Lowers blood glucose levels. YOUR ACTIVITY PLAN  Choose an activity that you enjoy and set realistic goals. Your health care provider or diabetes educator can help you make an activity plan that works for you. You can break activities into 2 or 3 sessions throughout the day. Doing so is as good as one long session. Exercise ideas include:  Taking the dog for a walk.  Taking the stairs instead of the elevator.  Dancing to your favorite song.  Doing your favorite exercise with a friend. RECOMMENDATIONS FOR EXERCISING WITH TYPE 1 OR TYPE 2 DIABETES   Check your blood glucose before exercising. If blood glucose levels are greater than 240 mg/dL, check for urine ketones. Do not exercise if ketones are present.  Avoid injecting insulin into areas of the body that are going to be exercised. For example, avoid injecting insulin into:  The arms when playing tennis.  The legs when  jogging.  Keep a record of:  Food intake before and after you exercise.  Expected peak times of insulin action.  Blood glucose levels before and after you exercise.  The type and amount of exercise you have done.  Review your records with your health care provider. Your health care provider will help you to develop guidelines for adjusting food intake and insulin amounts before and after exercising.  If you take insulin or oral hypoglycemic agents, watch for signs and symptoms of hypoglycemia. They include:  Dizziness.  Shaking.  Sweating.  Chills.  Confusion.  Drink plenty of water while you exercise to prevent dehydration or heat stroke. Body water is lost during exercise and must be replaced.  Talk to your health care provider before starting an exercise program to make sure it is safe for you. Remember, almost any type of activity is better than none. Document Released: 02/21/2004 Document Revised: 08/03/2013 Document Reviewed: 05/10/2013 [facility] Patient Information 2014 [place].  Thoracoscopy Care After Refer to this sheet in the next few weeks. These discharge instructions provide you with general information on caring for yourself after you leave the hospital. Your caregiver may also give you specific instructions. Your treatment has been planned according to the most current medical practices available, but unavoidable complications sometimes occur. If you have any problems or questions after discharge, call your caregiver. HOME CARE INSTRUCTIONS   Remove the bandage (dressing) over your chest tube site as directed by your caregiver.  It is normal to be sore for a couple weeks following surgery. See your caregiver if this seems to be getting worse rather than better.  Only take over-the-counter or prescription  medicines for pain, discomfort, or fever as directed by your caregiver. It is very important to take pain medicine when you need it so that you will  cough and breathe deeply enough to clear mucus (phlegm) and expand your lungs.  If it hurts to cough, hold a pillow against your chest when you cough. This may help with the discomfort. In spite of the discomfort, cough frequently, as this helps protect against getting an infection in your lung (pneumonia).  Taking deep breaths keeps lungs inflated and protects against pneumonia. Most patients will go home with an incentive spirometer that encourages deep breathing.  You may resume a normal diet and activities as directed.  Use showers for bathing until you see your caregiver, or as instructed.  Change dressings if necessary or as directed.  Avoid lifting or driving until you are instructed otherwise.  Make an appointment to see your caregiver for stitch (suture) or staple removal when instructed.  Do not travel by airplane for 2 weeks after the chest tube is removed. SEEK MEDICAL CARE IF:   You are bleeding from your wounds.  You have redness, swelling, or increasing pain in the wounds.  Your heartbeat feels irregular or very fast.  There is pus coming from your wounds.  There is a bad smell coming from the wound or dressing. SEEK IMMEDIATE MEDICAL CARE IF:   You have a fever.  You develop a rash.  You have difficulty breathing.  You develop any reaction or side effects to medicines given.  You develop lightheadedness or feel faint.  You develop shortness of breath or chest pain. MAKE SURE YOU:   Understand these instructions.  Will watch your condition.  Will get help right away if you are not doing well or get worse. Document Released: 06/20/2005 Document Revised: 02/23/2012 Document Reviewed: 05/21/2011 [facility] Patient Information 2014 [place], [place].

## 2014-03-13 NOTE — Progress Notes (Addendum)
ANTIBIOTIC CONSULT NOTE - FOLLOW UP  Pharmacy Consult for vancomycin and zosyn Indication: empyema/PNA  No Known Allergies  Patient Measurements: Height: 5' 5"  (165.1 cm) Weight: 218 lb 7.6 oz (99.1 kg) IBW/kg (Calculated) : 61.5   Vital Signs: Temp: 97.9 F (36.6 C) (03/30 0719) Temp src: Oral (03/30 0719) BP: 113/65 mmHg (03/30 0700) Pulse Rate: 102 (03/30 0700) Intake/Output from previous day: 03/29 0701 - 03/30 0700 In: 6132.1 [P.O.:1040; I.V.:3042.1; IV Piggyback:2050] Out: 2480 [Urine:1890; Blood:100; Chest Tube:490] Intake/Output from this shift: Total I/O In: 347.2 [P.O.:120; I.V.:177.2; IV Piggyback:50] Out: 105 [Urine:75; Chest Tube:30]  Labs:  Recent Labs  03/11/14 1600 03/11/14 2141 03/12/14 0345 03/13/14 0400  WBC 24.1*  --  21.0* 19.7*  HGB 15.7  --  15.2 11.5*  PLT 412*  --  354 292  CREATININE 0.63 0.63 0.62 0.65   Estimated Creatinine Clearance: 131.5 ml/min (by C-G formula based on Cr of 0.65). No results found for this basename: VANCOTROUGH, VANCOPEAK, VANCORANDOM, [place], GENTPEAK, GENTRANDOM, TOBRATROUGH, TOBRAPEAK, TOBRARND, AMIKACINPEAK, AMIKACINTROU, AMIKACIN,  in the last 72 hours   Microbiology: Recent Results (from the past 720 hour(s))  BODY FLUID CULTURE     Status: None   Collection Time    03/09/14  2:24 PM      Result Value Ref Range Status   Specimen Description PLEURAL   Final   Special Requests NONE   Final   Gram Stain     Final   Value: FEW WBC PRESENT,BOTH PMN AND MONONUCLEAR     NO ORGANISMS SEEN     Performed at [facility]   Culture     Final   Value: MODERATE STREPTOCOCCUS INTERMEDIUS     Performed at [facility]   Report Status 03/12/2014 FINAL   Final   Organism ID, Bacteria STREPTOCOCCUS INTERMEDIUS   Final  CULTURE, BLOOD (ROUTINE X 2)     Status: None   Collection Time    03/10/14  8:40 PM      Result Value Ref Range Status   Specimen Description BLOOD RIGHT ARM   Final   Special  Requests BOTTLES DRAWN AEROBIC ONLY 5CC   Final   Culture  Setup Time     Final   Value: 03/10/2014 23:55     Performed at [facility]   Culture     Final   Value:        BLOOD CULTURE RECEIVED NO GROWTH TO DATE CULTURE WILL BE HELD FOR 5 DAYS BEFORE ISSUING A FINAL NEGATIVE REPORT     Performed at [facility]   Report Status PENDING   Incomplete  CULTURE, BLOOD (ROUTINE X 2)     Status: None   Collection Time    03/10/14  8:50 PM      Result Value Ref Range Status   Specimen Description BLOOD LEFT HAND   Final   Special Requests BOTTLES DRAWN AEROBIC ONLY [place]   Final   Culture  Setup Time     Final   Value: 03/10/2014 23:56     Performed at [facility]   Culture     Final   Value:        BLOOD CULTURE RECEIVED NO GROWTH TO DATE CULTURE WILL BE HELD FOR 5 DAYS BEFORE ISSUING A FINAL NEGATIVE REPORT     Performed at [facility]   Report Status PENDING   Incomplete  SURGICAL PCR SCREEN     Status: None  Collection Time    03/12/14  4:01 AM      Result Value Ref Range Status   MRSA, PCR NEGATIVE  NEGATIVE Final   Staphylococcus aureus NEGATIVE  NEGATIVE Final   Comment:            The Xpert SA Assay (FDA     approved for NASAL specimens     in patients over 72 years of age),     is one component of     a comprehensive surveillance     program.  Test performance has     been validated by [employer] for patients greater     than or equal to 75 year old.     It is not intended     to diagnose infection nor to     guide or monitor treatment.  BODY FLUID CULTURE     Status: None   Collection Time    03/12/14 10:14 AM      Result Value Ref Range Status   Specimen Description FLUID RIGHT PLEURAL   Final   Special Requests PATIENT ON FOLLOWING VANCO ADN ZOSYN NO1   Final   Gram Stain     Final   Value: WBC PRESENT, PREDOMINANTLY PMN     ABUNDANT GRAM POSITIVE COCCI IN PAIRS     IN CHAINS ABUNDANT GRAM NEGATIVE COCCOBACILLI     Performed  at [facility]   Culture PENDING   Incomplete   Report Status PENDING   Incomplete  TISSUE CULTURE     Status: None   Collection Time    03/12/14 10:19 AM      Result Value Ref Range Status   Specimen Description TISSUE   Final   Special Requests     Final   Value: PATIENT ON FOLLOWING VANCO AND ZOSYN RIGHT PLEURAL PEEL   Gram Stain     Final   Value: FEW WBC PRESENT, PREDOMINANTLY PMN     RARE SQUAMOUS EPITHELIAL CELLS PRESENT     NO ORGANISMS SEEN     Performed at [facility]   Culture     Final   Value: NO GROWTH 1 DAY     Performed at [facility]   Report Status PENDING   Incomplete  ANAEROBIC CULTURE     Status: None   Collection Time    03/12/14 10:19 AM      Result Value Ref Range Status   Specimen Description TISSUE   Final   Special Requests     Final   Value: PATIENT ON FOLLOWING VANCO AND ZOSYN RIGHT PLEURAL PEEL   Gram Stain     Final   Value: FEW WBC PRESENT, PREDOMINANTLY PMN     RARE SQUAMOUS EPITHELIAL CELLS PRESENT     NO ORGANISMS SEEN     Performed at [facility]   Culture PENDING   Incomplete   Report Status PENDING   Incomplete    Anti-infectives   Start     Dose/Rate Route Frequency Ordered Stop   03/10/14 2000  vancomycin (VANCOCIN) IVPB 1000 mg/200 mL premix     1,000 mg 200 mL/hr over 60 Minutes Intravenous Every 8 hours 03/10/14 1844     03/10/14 2000  piperacillin-tazobactam (ZOSYN) IVPB 3.375 g     3.375 g 12.5 mL/hr over 240 Minutes Intravenous Every 8 hours 03/10/14 1844        Assessment: Patient is a 43 y.o Mo on  vancomycin and zosyn for empyema/PNA.  S/p VATS on 3/29 with drainage tubes still in placed.  He remains afebrile with wbc trending down.  Vancomycin 3/27> Zosyn 3/27>  3/27 Bcx x2>> pending 3/29 right pleural fluid>> GNC bacilli, GBC in pairs 3/29 anaerobic pleural tissue>> ngtd 3/29 pleural tissue>> ngtd  Goal of Therapy:  Vancomycin trough level 15-20 mcg/ml  Plan:  1) Continue  vancomycin 1gm q8h and zosyn 3.375gm IV q8h (infuse over 4 hours) for now 2) will check vancomycin trough level with noon dose today to ensure current regimen is appropriate for indication and to r/o accumulation.   Umer Harig P 03/13/2014,8:25 AM  Adden: Vancomycin trough level now back sub-therapeutic at 6.7. Will change dose to 1710m IV q8h and re-check another level prior to the 4th dose to assess this new regimen.

## 2014-03-14 ENCOUNTER — Encounter (HOSPITAL_COMMUNITY): Payer: Self-pay | Admitting: Thoracic Surgery (Cardiothoracic Vascular Surgery)

## 2014-03-14 ENCOUNTER — Inpatient Hospital Stay (HOSPITAL_COMMUNITY): Payer: 59

## 2014-03-14 DIAGNOSIS — A419 Sepsis, unspecified organism: Secondary | ICD-10-CM | POA: Diagnosis present

## 2014-03-14 LAB — CBC
HEMATOCRIT: 31.9 % — AB (ref 39.0–52.0)
HEMOGLOBIN: 11 g/dL — AB (ref 13.0–17.0)
MCH: 29.7 pg (ref 26.0–34.0)
MCHC: 34.5 g/dL (ref 30.0–36.0)
MCV: 86.2 fL (ref 78.0–100.0)
Platelets: 305 10*3/uL (ref 150–400)
RBC: 3.7 MIL/uL — ABNORMAL LOW (ref 4.22–5.81)
RDW: 13.2 % (ref 11.5–15.5)
WBC: 12.8 10*3/uL — ABNORMAL HIGH (ref 4.0–10.5)

## 2014-03-14 LAB — COMPREHENSIVE METABOLIC PANEL
ALBUMIN: 1.6 g/dL — AB (ref 3.5–5.2)
ALK PHOS: 68 U/L (ref 39–117)
ALT: 50 U/L (ref 0–53)
AST: 25 U/L (ref 0–37)
BUN: 11 mg/dL (ref 6–23)
CO2: 22 mEq/L (ref 19–32)
CREATININE: 0.65 mg/dL (ref 0.50–1.35)
Calcium: 8.1 mg/dL — ABNORMAL LOW (ref 8.4–10.5)
Chloride: 103 mEq/L (ref 96–112)
GFR calc non Af Amer: >90 mL/min (ref >90)
GLUCOSE: 159 mg/dL — AB (ref 70–99)
POTASSIUM: 3.1 meq/L — AB (ref 3.7–5.3)
Sodium: 138 mEq/L (ref 137–147)
Total Bilirubin: 0.3 mg/dL (ref 0.3–1.2)
Total Protein: 4.9 g/dL — ABNORMAL LOW (ref 6.0–8.3)

## 2014-03-14 LAB — GLUCOSE, CAPILLARY
GLUCOSE-CAPILLARY: 137 mg/dL — AB (ref 70–99)
GLUCOSE-CAPILLARY: 130 mg/dL — AB (ref 70–99)
GLUCOSE-CAPILLARY: 161 mg/dL — AB (ref 70–99)
Glucose-Capillary: 143 mg/dL — ABNORMAL HIGH (ref 70–99)
Glucose-Capillary: 184 mg/dL — ABNORMAL HIGH (ref 70–99)

## 2014-03-14 LAB — VANCOMYCIN, TROUGH: Vancomycin Tr: 19.2 ug/mL (ref 10.0–20.0)

## 2014-03-14 MED ORDER — POTASSIUM CHLORIDE CRYS ER 20 MEQ PO TBCR
40.0000 meq | EXTENDED_RELEASE_TABLET | Freq: Once | ORAL | Status: AC
  Administered 2014-03-14: 40 meq via ORAL
  Filled 2014-03-14: qty 2

## 2014-03-14 NOTE — Progress Notes (Signed)
PROGRESS NOTE  Dean Wallace ZOX:[id number] DOB: 1971/06/11 DOA: 03/10/2014 PCP: Crisoforo Oxford, PA-C  HPI/Interval History  43 year old male with a history of diet-controlled diabetes mellitus who was seen at the primary care office on 3/25 for cough which has been going for 2 weeks, did not improve with Claritin. Patient on that the complained of severe back muscle spasm, mid to upper back when he tried to get out of bed. Patient was sent for imaging studies including d-dimer, chest x-ray, thoracic spine x-ray. Chest x-ray showed collapse/consolidation of the right lower lobe and right middle lobe most commonly associated with multifocal pneumonia, CT chest was recommended, patient was called for thoracentesis under ultrasound guidance, and underwent the procedure on 3/26, pleural fluid analysis showed exudative effusion. Patient underwent CT chest on 3/27 to rule out pulmonary embolism, which showed large loculated right pleural effusion worrisome for empyema with compressive atelectasis in the right lower lobe.  Patient admitted to telemetry floor on 3/27 and TCV consulted, underwent VATS with decortication on 3/28.   Assessment/Plan: Diabetes mellitus  - A1C shows DM out of control, on gtt post op, started on Levemir and SSI on 03/13/14, better sugars contol. Patient hungry and wants to eat. - he will need insulin on discharge and he had nursing teaching and now can self administer insulin. Will ask for diabetes educator consult.  Empyema of the right pleural space - POD #2 - cultures with GPC and GN coccobacilli. Continue antibiotics, narrow as indicated.  - chest tubes management per TCV Sepsis due to #2 - present on admission, tachypnea, tachycardia, leukocytosis and positive infectious source.  HTN - borderline, monitor for now, if hypertensive on discharge will need an ACEI Leukocytosis - improving.   Diet: carb modified Fluids: NS DVT Prophylaxis: lovenox  Code Status:  Full Family Communication: d/w patient Disposition Plan: inpatient  Consultants:  Dr. Roxan Hockey surgery   Procedures:  none   Antibiotics Vancomycin 3/27 >> Zosyn 3/27 >>  HPI/Subjective: - feeling improved, breathing better, pain controlled.   Objective: Filed Vitals:   03/14/14 0405 03/14/14 0500 03/14/14 0600 03/14/14 0700  BP:  142/69 147/71 130/74  Pulse:  105 107 106  Temp: 97.9 F (36.6 C)   98.9 F (37.2 C)  TempSrc: Oral   Oral  Resp:  26 30 23   Height:      Weight:   102.6 kg (226 lb 3.1 oz)   SpO2:  97% 97% 95%    Intake/Output Summary (Last 24 hours) at 03/14/14 0804 Last data filed at 03/14/14 0700  Gross per 24 hour  Intake 3462.3 ml  Output   2590 ml  Net  872.3 ml   Filed Weights   03/12/14 0447 03/13/14 0600 03/14/14 0600  Weight: 96 kg (211 lb 10.3 oz) 99.1 kg (218 lb 7.6 oz) 102.6 kg (226 lb 3.1 oz)   Exam:  General:  NAD  Cardiovascular: regular rate and rhythm, without MRG, tachycardic  Respiratory: good air movement, no wheezing, improved aeration post VATS  Abdomen: soft, not tender to palpation, positive bowel sounds  MSK: no peripheral edema  Neuro: non focal  Data Reviewed: Basic Metabolic Panel:  Recent Labs Lab 03/11/14 1600 03/11/14 2141 03/12/14 0345 03/12/14 1017 03/12/14 1134 03/13/14 0400 03/14/14 0420  NA 131* 131* 133*  --   --  139 138  K 4.2 4.4 4.2  --   --  3.6* 3.1*  CL 94* 95* 96  --   --  107 103  CO2 13* 15* 15*  --   --  20 22  GLUCOSE 348* 350* 323* 342* 304* 165* 159*  BUN 10 10 11   --   --  13 11  CREATININE 0.63 0.63 0.62  --   --  0.65 0.65  CALCIUM 10.0 9.7 9.7  --   --  8.0* 8.1*   Liver Function Tests:  Recent Labs Lab 03/10/14 2050 03/11/14 0440 03/11/14 1600 03/14/14 0420  AST 13 12 13 25   ALT 22 22 25  50  ALKPHOS 81 88 96 68  BILITOT 0.5 0.5 0.5 0.3  PROT 6.9 7.1 7.7 4.9*  ALBUMIN 2.5* 2.6* 2.6* 1.6*   CBC:  Recent Labs Lab 03/08/14 1206 03/10/14 2050  03/11/14 0440 03/11/14 1600 03/12/14 0345 03/13/14 0400 03/14/14 0420  WBC 20.2* 20.0* 21.1* 24.1* 21.0* 19.7* 12.8*  NEUTROABS  --  16.7*  --   --  17.9*  --   --   HGB 14.8 14.4 15.2 15.7 15.2 11.5* 11.0*  HCT 43.1 40.6 43.2 44.7 43.0 33.3* 31.9*  MCV 85.7 86.0 87.3 87.3 87.2 86.9 86.2  PLT 329 336 354 412* 354 292 305   Recent Results (from the past 240 hour(s))  BODY FLUID CULTURE     Status: None   Collection Time    03/09/14  2:24 PM      Result Value Ref Range Status   Specimen Description PLEURAL   Final   Special Requests NONE   Final   Gram Stain     Final   Value: FEW WBC PRESENT,BOTH PMN AND MONONUCLEAR     NO ORGANISMS SEEN     Performed at [facility]   Culture     Final   Value: MODERATE STREPTOCOCCUS INTERMEDIUS     Performed at [facility]   Report Status 03/12/2014 FINAL   Final   Organism ID, Bacteria STREPTOCOCCUS INTERMEDIUS   Final  CULTURE, BLOOD (ROUTINE X 2)     Status: None   Collection Time    03/10/14  8:40 PM      Result Value Ref Range Status   Specimen Description BLOOD RIGHT ARM   Final   Special Requests BOTTLES DRAWN AEROBIC ONLY 5CC   Final   Culture  Setup Time     Final   Value: 03/10/2014 23:55     Performed at [facility]   Culture     Final   Value:        BLOOD CULTURE RECEIVED NO GROWTH TO DATE CULTURE WILL BE HELD FOR 5 DAYS BEFORE ISSUING A FINAL NEGATIVE REPORT     Performed at [facility]   Report Status PENDING   Incomplete  CULTURE, BLOOD (ROUTINE X 2)     Status: None   Collection Time    03/10/14  8:50 PM      Result Value Ref Range Status   Specimen Description BLOOD LEFT HAND   Final   Special Requests BOTTLES DRAWN AEROBIC ONLY [place]   Final   Culture  Setup Time     Final   Value: 03/10/2014 23:56     Performed at [facility]   Culture     Final   Value:        BLOOD CULTURE RECEIVED NO GROWTH TO DATE CULTURE WILL BE HELD FOR 5 DAYS BEFORE ISSUING A FINAL NEGATIVE  REPORT     Performed at [facility]   Report Status PENDING  Incomplete  SURGICAL PCR SCREEN     Status: None   Collection Time    03/12/14  4:01 AM      Result Value Ref Range Status   MRSA, PCR NEGATIVE  NEGATIVE Final   Staphylococcus aureus NEGATIVE  NEGATIVE Final   Comment:            The Xpert SA Assay (FDA     approved for NASAL specimens     in patients over 43 years of age),     is one component of     a comprehensive surveillance     program.  Test performance has     been validated by [employer] for patients greater     than or equal to 86 year old.     It is not intended     to diagnose infection nor to     guide or monitor treatment.  BODY FLUID CULTURE     Status: None   Collection Time    03/12/14 10:14 AM      Result Value Ref Range Status   Specimen Description FLUID RIGHT PLEURAL   Final   Special Requests PATIENT ON FOLLOWING VANCO ADN ZOSYN NO1   Final   Gram Stain     Final   Value: WBC PRESENT, PREDOMINANTLY PMN     ABUNDANT GRAM POSITIVE COCCI IN PAIRS     IN CHAINS ABUNDANT GRAM NEGATIVE COCCOBACILLI     Performed at [facility]   Culture     Final   Value: NO GROWTH 1 DAY     Performed at [facility]   Report Status PENDING   Incomplete  TISSUE CULTURE     Status: None   Collection Time    03/12/14 10:19 AM      Result Value Ref Range Status   Specimen Description TISSUE   Final   Special Requests     Final   Value: PATIENT ON FOLLOWING VANCO AND ZOSYN RIGHT PLEURAL PEEL   Gram Stain     Final   Value: FEW WBC PRESENT, PREDOMINANTLY PMN     RARE SQUAMOUS EPITHELIAL CELLS PRESENT     NO ORGANISMS SEEN     Performed at [facility]   Culture     Final   Value: NO GROWTH 2 DAYS     Performed at [facility]   Report Status PENDING   Incomplete  ANAEROBIC CULTURE     Status: None   Collection Time    03/12/14 10:19 AM      Result Value Ref Range Status   Specimen Description TISSUE   Final    Special Requests     Final   Value: PATIENT ON FOLLOWING VANCO AND ZOSYN RIGHT PLEURAL PEEL   Gram Stain     Final   Value: FEW WBC PRESENT, PREDOMINANTLY PMN     RARE SQUAMOUS EPITHELIAL CELLS PRESENT     NO ORGANISMS SEEN     Performed at [facility]   Culture     Final   Value: NO ANAEROBES ISOLATED; CULTURE IN PROGRESS FOR 5 DAYS     Performed at [facility]   Report Status PENDING   Incomplete   Studies: Dg Chest Port 1 View  03/14/2014   CLINICAL DATA:  decortication  EXAM: PORTABLE CHEST - 1 VIEW  COMPARISON:  03/13/2014  FINDINGS: One of 2 right chest tubes has been  removed. Right pleural thickening/ effusion and right lower lobe atelectasis unchanged. No pneumothorax.  Right jugular catheter tip in the SVC. Cardiac enlargement without heart failure. Left lung remains clear.  IMPRESSION: Negative for pneumothorax on the right. Right pleural thickening and right lower lobe atelectasis unchanged.   Electronically Signed   By: Franchot Gallo M.D.   On: 03/14/2014 07:44   Dg Chest Port 1 View  03/13/2014   CLINICAL DATA:  Status post VATS for right empyema.  EXAM: PORTABLE CHEST - 1 VIEW  COMPARISON:  DG CHEST 1V PORT dated 03/12/2014; DG CHEST 2 VIEW dated 03/11/2014; CT ANGIO CHEST W/CM &/OR WO/CM dated 03/10/2014  FINDINGS: Stable positioning of 2 separate right chest tubes. No pneumothorax or significant reaccumulation of right pleural fluid is identified. There is stable significant volume loss of the right lung with marginally improved aeration. Aeration of the left lung has improved since prior study. Stable cardiomegaly.  IMPRESSION: Improved aeration. No right-sided pneumothorax or pleural fluid reaccumulation.   Electronically Signed   By: Aletta Edouard M.D.   On: 03/13/2014 07:18   Dg Chest Portable 1 View  03/12/2014   CLINICAL DATA:  Post VATS for empyema.  EXAM: PORTABLE CHEST - 1 VIEW  COMPARISON:  03/11/2014  FINDINGS: There appears to be three right-sided  chest tubes. There are patchy densities throughout the right lung. Overall, there are low lung volumes. Right jugular central line has been placed. Catheter tip in lower SVC. No evidence for a pneumothorax. Heart size is grossly stable.  IMPRESSION: Central venous catheter tip in the lower SVC.  Right chest tubes without a large pneumothorax.  Patchy densities throughout the right lung.   Electronically Signed   By: Markus Daft M.D.   On: 03/12/2014 13:22   Scheduled Meds: . bisacodyl  10 mg Oral Daily  . enoxaparin (LOVENOX) injection  40 mg Subcutaneous Q24H  . fentaNYL   Intravenous 6 times per day  . insulin aspart  0-15 Units Subcutaneous TID WC  . insulin aspart  3 Units Subcutaneous TID WC  . insulin detemir  25 Units Subcutaneous BID  . pantoprazole  40 mg Oral Daily  . piperacillin-tazobactam (ZOSYN)  IV  3.375 g Intravenous Q8H  . potassium chloride  40 mEq Oral Once  . vancomycin  1,750 mg Intravenous Q8H   Continuous Infusions: . 0.9 % NaCl with KCl 20 mEq / L 50 mL/hr at 03/14/14 0700   Active Problems:   Empyema of right pleural space   DM (diabetes mellitus)   Loculated pleural effusion   Sepsis  Time spent: 25  This note has been created with Surveyor, quantity. Any transcriptional errors are unintentional.   Marzetta Board, MD [facility] Pager [phone]. If 7 PM - 7 AM, please contact night-coverage at [email or web address], password [facility] 03/14/2014, 8:04 AM  LOS: 4 days

## 2014-03-14 NOTE — Op Note (Signed)
NAMEFUMIO, VANDAM NO.:  [id number]  MEDICAL RECORD NO.:  [id number]  LOCATION:  2S01C                        FACILITY:  [facility]  PHYSICIAN:  Revonda Standard. Roxan Hockey, M.D.DATE OF BIRTH:  May 04, 1971  DATE OF PROCEDURE:  03/12/2014 DATE OF DISCHARGE:                              OPERATIVE REPORT   PREOPERATIVE DIAGNOSIS:  Loculated right pleural effusion.  POSTOPERATIVE DIAGNOSIS:  Early organizing empyema, right chest.  PROCEDURE:  Right video-assisted thoracoscopy, drainage of empyema, visceral and parietal pleural decortication.  SURGEON:  Revonda Standard. Roxan Hockey, M.D.  ASSISTANT:  Lars Pinks, PA  ANESTHESIA:  General.  FINDINGS:  Early organizing empyema.  Multiple loculated fluid collections with extensive fibrinous debris.  Fibrinous pleural peel on visceral and parietal pleura.  Peel came off the visceral pleura easily.  CLINICAL NOTE:  Mr. Meadowcroft is a 43 year old gentleman who presented with 2 weeks of cough followed by sudden onset of right-sided back and chest pain.  A chest x-ray showed a pleural effusion.  Thoracentesis was consistent with exudate.  A CT scan showed a loculated parapneumonic effusion with extensive collapse of the lower and middle lobes.  The patient was admitted to the hospital and advised to undergo right video- assisted thoracoscopy for evacuation of the pleural effusion and possible decortication. The indications, risks, benefits, and alternatives were discussed in detail with the patient.  He understood and accepted the risks and agreed to proceed.  OPERATIVE NOTE:  Mr. Albor was brought to the operating room on March 12, 2014. He was anesthetized and intubated with a double-lumen endotracheal tube.  He was already receiving intravenous vancomycin and Zosyn, so no additional antibiotics were necessary in the operating room.  A Foley catheter was placed.  Sequential compression devices were placed on the calves  for DVT prophylaxis.  He was placed in a left lateral decubitus position and the right chest was prepped and draped in the usual sterile fashion.  Single lung ventilation of the left lung was Initiated and was tolerated well throughout the procedure.  A small port type incision was made in approximately the 7th intercostal space in the midaxillary line was carried through the skin and subcutaneous tissue. The chest was entered bluntly using a hemostat.  A port was inserted, and the thoracoscope was placed to the chest.  A small utility incision approximately 5 cm in length was made in the fourth intercostal space Anterolaterally. No rib spreading was performed during the procedure. It was immediately apparent that there were multiple loculated fluid collections.  The fluid was murky.  It was sent for both cytology and cultures.  Tissue was also sent for pathology and cultures.  The loculations were broken up.  There was extensive debris in the chest. Approximately 2 L of fluid and debris was evacuated.  The lower and middle lobes were completely encased, the upper lobe was relatively spared.  The lung was taken down circumferentially. As the adhesions were taken down, the loculations were broken up.  Care was taken not to by violate the visceral pleura.  During portion of this dissection,  an extrapleural plane developed and the pleural peel was taken off the chest wall.  This was  very friable and did bleed easily, but came off without undue difficulty.  The chest was periodically irrigated with warm saline to clear clot and debris.  The dissection was carried down along the diaphragm and into the costophrenic angle to ensure that all debris was removed from that area.  Attention then was turned to the visceral pleural peel.  This was most dense on the posterolateral portion of the lower lobe as well as the diaphragmatic surface of the lower lobe.  There also was peel or fibrinous exudate  on the portion of the upper lobe adjacent to the superior segment in the major fissure, as well as on the upper and middle lobes along the minor fissure.  Loculated fluid collections within the fissure were broken up and evacuated and the peel was stripped in that area.  There was only mild involvement of the anterior aspect of the middle lobe.  The majority of the decortication involved the diaphragmatic and posterolateral aspects of the lower lobe.  The peel did come off relatively easily without significant tears of the visceral pleura.  Again, the visceral peel was sent both for pathology and culture.  After completing the Decortication, removing approximately 95% of the peel, a test inflation showed good aeration of all 3 lobes of the right lung.  The chest was again copiously irrigated with warm saline. A 28-French chest tube was placed anteriorly and 32-French Bard drains were placed along the diaphragmatic surface and posterolaterally. All tubes were secured with #1 silk sutures, and these were attached to Pleur-Evac drainage.  The right lung was reinflated.  The utility incision was closed with a #1 Vicryl, fascial suture followed by 2-0 Vicryl subcutaneous suture and 3- 0 Vicryl subcuticular suture.  The patient was placed back in the supine position.  He was extubated in the operating room, taken to the postanesthetic care unit in good condition.     Revonda Standard Roxan Hockey, M.D.     SCH/MEDQ  D:  03/13/2014  T:  03/14/2014  Job:  [id number]

## 2014-03-14 NOTE — Progress Notes (Signed)
Inpatient Diabetes Program Recommendations  AACE/ADA: New Consensus Statement on Inpatient Glycemic Control (2013)  Target Ranges:  Prepandial:   less than 140 mg/dL      Peak postprandial:   less than 180 mg/dL (1-2 hours)      Critically ill patients:  140 - 180 mg/dL   Note:  Consult to Diabetes Coordinator received regarding diabetes out of control.  Rubie Maid, Diabetes Coordinator, saw patient yesterday.  Will follow-up further with patient after he moves from ICU.  Thank you.  Donyea Gafford S. Marcelline Mates, RN, MSN, CDE

## 2014-03-14 NOTE — Progress Notes (Signed)
T[facility] DAILY ICU PROGRESS NOTE                   [address]            [place] [address]          [phone]   2 Days Post-Op Procedure(s) (LRB): VIDEO ASSISTED THORACOSCOPY (VATS)/EMPYEMA (Right) DECORTICATION (Right)  Total Length of Stay:  LOS: 4 days   Subjective: Feeling better, minimal cough/sputum production  Objective: Vital signs in last 24 hours: Temp:  [97.9 F (36.6 C)-98.9 F (37.2 C)] 98.9 F (37.2 C) (03/31 0700) Pulse Rate:  [96-112] 106 (03/31 0700) Cardiac Rhythm:  [-] Normal sinus rhythm;Sinus tachycardia (03/31 0700) Resp:  [19-31] 23 (03/31 0700) BP: (116-147)/(58-76) 130/74 mmHg (03/31 0700) SpO2:  [94 %-99 %] 95 % (03/31 0700) Weight:  [226 lb 3.1 oz (102.6 kg)] 226 lb 3.1 oz (102.6 kg) (03/31 0600)  Filed Weights   03/12/14 0447 03/13/14 0600 03/14/14 0600  Weight: 211 lb 10.3 oz (96 kg) 218 lb 7.6 oz (99.1 kg) 226 lb 3.1 oz (102.6 kg)    Weight change: 7 lb 11.5 oz (3.5 kg)   Hemodynamic parameters for last 24 hours:    Intake/Output from previous day: 03/30 0701 - 03/31 0700 In: 3809.5 [P.O.:600; I.V.:1372; IV Piggyback:1837.5] Out: 2695 [Urine:2475; Chest Tube:220]  Intake/Output this shift:    Current Meds: Scheduled Meds: . bisacodyl  10 mg Oral Daily  . enoxaparin (LOVENOX) injection  40 mg Subcutaneous Q24H  . fentaNYL   Intravenous 6 times per day  . insulin aspart  0-15 Units Subcutaneous TID WC  . insulin aspart  3 Units Subcutaneous TID WC  . insulin detemir  25 Units Subcutaneous BID  . pantoprazole  40 mg Oral Daily  . piperacillin-tazobactam (ZOSYN)  IV  3.375 g Intravenous Q8H  . potassium chloride  40 mEq Oral Once  . vancomycin  1,750 mg Intravenous Q8H   Continuous Infusions: . 0.9 % NaCl with KCl 20 mEq / L 50 mL/hr at 03/14/14 0700   PRN Meds:.diphenhydrAMINE, diphenhydrAMINE, guaiFENesin, naloxone, ondansetron (ZOFRAN) IV, ondansetron (ZOFRAN) IV, oxyCODONE-acetaminophen, potassium  chloride, senna-docusate, sodium chloride, traMADol  General appearance: alert, cooperative and no distress Heart: regular rate and rhythm Lungs: dim in right base Abdomen: benign Extremities: no edema Wound: healing well  Lab Results: CBC: Recent Labs  03/13/14 0400 03/14/14 0420  WBC 19.7* 12.8*  HGB 11.5* 11.0*  HCT 33.3* 31.9*  PLT 292 305   BMET:  Recent Labs  03/13/14 0400 03/14/14 0420  NA 139 138  K 3.6* 3.1*  CL 107 103  CO2 20 22  GLUCOSE 165* 159*  BUN 13 11  CREATININE 0.65 0.65  CALCIUM 8.0* 8.1*    PT/INR:  Recent Labs  03/11/14 1600  LABPROT 15.2  INR 1.23   Radiology: Dg Chest Port 1 View  03/14/2014   CLINICAL DATA:  decortication  EXAM: PORTABLE CHEST - 1 VIEW  COMPARISON:  03/13/2014  FINDINGS: One of 2 right chest tubes has been removed. Right pleural thickening/ effusion and right lower lobe atelectasis unchanged. No pneumothorax.  Right jugular catheter tip in the SVC. Cardiac enlargement without heart failure. Left lung remains clear.  IMPRESSION: Negative for pneumothorax on the right. Right pleural thickening and right lower lobe atelectasis unchanged.   Electronically Signed   By: Franchot Gallo M.D.   On: 03/14/2014 07:44   Dg Chest Port 1 View  03/13/2014   CLINICAL DATA:  Status post VATS for right empyema.  EXAM: PORTABLE CHEST - 1 VIEW  COMPARISON:  DG CHEST 1V PORT dated 03/12/2014; DG CHEST 2 VIEW dated 03/11/2014; CT ANGIO CHEST W/CM &/OR WO/CM dated 03/10/2014  FINDINGS: Stable positioning of 2 separate right chest tubes. No pneumothorax or significant reaccumulation of right pleural fluid is identified. There is stable significant volume loss of the right lung with marginally improved aeration. Aeration of the left lung has improved since prior study. Stable cardiomegaly.  IMPRESSION: Improved aeration. No right-sided pneumothorax or pleural fluid reaccumulation.   Electronically Signed   By: Aletta Edouard M.D.   On: 03/13/2014 07:18    Dg Chest Portable 1 View  03/12/2014   CLINICAL DATA:  Post VATS for empyema.  EXAM: PORTABLE CHEST - 1 VIEW  COMPARISON:  03/11/2014  FINDINGS: There appears to be three right-sided chest tubes. There are patchy densities throughout the right lung. Overall, there are low lung volumes. Right jugular central line has been placed. Catheter tip in lower SVC. No evidence for a pneumothorax. Heart size is grossly stable.  IMPRESSION: Central venous catheter tip in the lower SVC.  Right chest tubes without a large pneumothorax.  Patchy densities throughout the right lung.   Electronically Signed   By: Markus Daft M.D.   On: 03/12/2014 13:22   Chest tube: no air leak, 220 cc drainage- appears sero-sang    Assessment/Plan: S/P Procedure(s) (LRB): VIDEO ASSISTED THORACOSCOPY (VATS)/EMPYEMA (Right) DECORTICATION (Right)   1 steady progress 2 keep CT for now 3 good UO, replace K+ 4 H/H stable 5 push pulm toilet/rehab 6 cont current abx wide spectrum coverage 7 poss tx to floor   Tynesia Harral E 03/14/2014 8:05 AM

## 2014-03-14 NOTE — Progress Notes (Signed)
ANTIBIOTIC CONSULT NOTE - FOLLOW UP  Pharmacy Consult for vancomycin and zosyn Indication: empyema/PNA  No Known Allergies  Patient Measurements: Height: 5' 5"  (165.1 cm) Weight: 226 lb 3.1 oz (102.6 kg) IBW/kg (Calculated) : 61.5   Vital Signs: Temp: 98 F (36.7 C) (03/31 1208) Temp src: Oral (03/31 1208) BP: 147/80 mmHg (03/31 1400) Pulse Rate: 107 (03/31 1400) Intake/Output from previous day: 03/30 0701 - 03/31 0700 In: 3809.5 [P.O.:600; I.V.:1372; IV Piggyback:1837.5] Out: 2695 [Urine:2475; Chest Tube:220] Intake/Output from this shift: Total I/O In: 1459 [P.O.:480; I.V.:366.5; IV Piggyback:612.5] Out: 1845 [Urine:1775; Chest Tube:70]  Labs:  Recent Labs  03/12/14 0345 03/13/14 0400 03/14/14 0420  WBC 21.0* 19.7* 12.8*  HGB 15.2 11.5* 11.0*  PLT 354 292 305  CREATININE 0.62 0.65 0.65   Estimated Creatinine Clearance: 133.9 ml/min (by C-G formula based on Cr of 0.65).  Recent Labs  03/13/14 1150 03/14/14 1330  VANCOTROUGH 6.7* 19.2     Microbiology: Recent Results (from the past 720 hour(s))  BODY FLUID CULTURE     Status: None   Collection Time    03/09/14  2:24 PM      Result Value Ref Range Status   Specimen Description PLEURAL   Final   Special Requests NONE   Final   Gram Stain     Final   Value: FEW WBC PRESENT,BOTH PMN AND MONONUCLEAR     NO ORGANISMS SEEN     Performed at [facility]   Culture     Final   Value: MODERATE STREPTOCOCCUS INTERMEDIUS     Performed at [facility]   Report Status 03/12/2014 FINAL   Final   Organism ID, Bacteria STREPTOCOCCUS INTERMEDIUS   Final  CULTURE, BLOOD (ROUTINE X 2)     Status: None   Collection Time    03/10/14  8:40 PM      Result Value Ref Range Status   Specimen Description BLOOD RIGHT ARM   Final   Special Requests BOTTLES DRAWN AEROBIC ONLY 5CC   Final   Culture  Setup Time     Final   Value: 03/10/2014 23:55     Performed at [facility]   Culture     Final    Value:        BLOOD CULTURE RECEIVED NO GROWTH TO DATE CULTURE WILL BE HELD FOR 5 DAYS BEFORE ISSUING A FINAL NEGATIVE REPORT     Performed at [facility]   Report Status PENDING   Incomplete  CULTURE, BLOOD (ROUTINE X 2)     Status: None   Collection Time    03/10/14  8:50 PM      Result Value Ref Range Status   Specimen Description BLOOD LEFT HAND   Final   Special Requests BOTTLES DRAWN AEROBIC ONLY [place]   Final   Culture  Setup Time     Final   Value: 03/10/2014 23:56     Performed at [facility]   Culture     Final   Value:        BLOOD CULTURE RECEIVED NO GROWTH TO DATE CULTURE WILL BE HELD FOR 5 DAYS BEFORE ISSUING A FINAL NEGATIVE REPORT     Performed at [facility]   Report Status PENDING   Incomplete  SURGICAL PCR SCREEN     Status: None   Collection Time    03/12/14  4:01 AM      Result Value Ref Range Status   MRSA,  PCR NEGATIVE  NEGATIVE Final   Staphylococcus aureus NEGATIVE  NEGATIVE Final   Comment:            The Xpert SA Assay (FDA     approved for NASAL specimens     in patients over 60 years of age),     is one component of     a comprehensive surveillance     program.  Test performance has     been validated by [employer] for patients greater     than or equal to 56 year old.     It is not intended     to diagnose infection nor to     guide or monitor treatment.  BODY FLUID CULTURE     Status: None   Collection Time    03/12/14 10:14 AM      Result Value Ref Range Status   Specimen Description FLUID RIGHT PLEURAL   Final   Special Requests PATIENT ON FOLLOWING VANCO ADN ZOSYN NO1   Final   Gram Stain     Final   Value: WBC PRESENT, PREDOMINANTLY PMN     ABUNDANT GRAM POSITIVE COCCI IN PAIRS     IN CHAINS ABUNDANT GRAM NEGATIVE COCCOBACILLI     Performed at [facility]   Culture     Final   Value: Culture reincubated for better growth     Performed at [facility]   Report Status PENDING   Incomplete   TISSUE CULTURE     Status: None   Collection Time    03/12/14 10:19 AM      Result Value Ref Range Status   Specimen Description TISSUE   Final   Special Requests     Final   Value: PATIENT ON FOLLOWING VANCO AND ZOSYN RIGHT PLEURAL PEEL   Gram Stain     Final   Value: FEW WBC PRESENT, PREDOMINANTLY PMN     RARE SQUAMOUS EPITHELIAL CELLS PRESENT     NO ORGANISMS SEEN     Performed at [facility]   Culture     Final   Value: NO GROWTH 2 DAYS     Performed at [facility]   Report Status PENDING   Incomplete  ANAEROBIC CULTURE     Status: None   Collection Time    03/12/14 10:19 AM      Result Value Ref Range Status   Specimen Description TISSUE   Final   Special Requests     Final   Value: PATIENT ON FOLLOWING VANCO AND ZOSYN RIGHT PLEURAL PEEL   Gram Stain     Final   Value: FEW WBC PRESENT, PREDOMINANTLY PMN     RARE SQUAMOUS EPITHELIAL CELLS PRESENT     NO ORGANISMS SEEN     Performed at [facility]   Culture     Final   Value: NO ANAEROBES ISOLATED; CULTURE IN PROGRESS FOR 5 DAYS     Performed at [facility]   Report Status PENDING   Incomplete    Anti-infectives   Start     Dose/Rate Route Frequency Ordered Stop   03/13/14 1400  vancomycin (VANCOCIN) 1,750 mg in sodium chloride 0.9 % 500 mL IVPB     1,750 mg 250 mL/hr over 120 Minutes Intravenous Every 8 hours 03/13/14 1322     03/10/14 2000  vancomycin (VANCOCIN) IVPB 1000 mg/200 mL premix  Status:  Discontinued  1,000 mg 200 mL/hr over 60 Minutes Intravenous Every 8 hours 03/10/14 1844 03/13/14 1321   03/10/14 2000  piperacillin-tazobactam (ZOSYN) IVPB 3.375 g     3.375 g 12.5 mL/hr over 240 Minutes Intravenous Every 8 hours 03/10/14 1844        Assessment: Patient is a 43 y.o M on vancomycin and zosyn (Day #5) for empyema/PNA.  S/p VATS on 3/29 with drainage tubes still in placed.  He remains afebrile with wbc trending down.  Vancomycin trough 19.2 (therapeutic) on  1749m IV q8h.  Vancomycin 3/27> Zosyn 3/27>  3/27 Bcx x2>> pending 3/29 bcx x2>> ngtd 3/29 right pleural fluid>> GNC bacilli, GBC in pairs 3/29 anaerobic pleural tissue>> ngtd 3/29 pleural tissue>> ngtd  Goal of Therapy:  Vancomycin trough level 15-20 mcg/ml  Plan:  1) Continue vancomycin [id number] and zosyn 3.375gm IV q8h (infuse over 4 hours)  2) Will f/u LOT with antibiotics and plan for weekly vanc trough   CaSherlon HandingPharmD, BCPS Clinical pharmacist, pager [phone]/31/2015,3:14 PM

## 2014-03-15 ENCOUNTER — Inpatient Hospital Stay (HOSPITAL_COMMUNITY): Payer: 59

## 2014-03-15 DIAGNOSIS — IMO0001 Reserved for inherently not codable concepts without codable children: Secondary | ICD-10-CM

## 2014-03-15 DIAGNOSIS — A419 Sepsis, unspecified organism: Secondary | ICD-10-CM

## 2014-03-15 DIAGNOSIS — E1165 Type 2 diabetes mellitus with hyperglycemia: Secondary | ICD-10-CM

## 2014-03-15 LAB — GLUCOSE, CAPILLARY
GLUCOSE-CAPILLARY: 194 mg/dL — AB (ref 70–99)
Glucose-Capillary: 150 mg/dL — ABNORMAL HIGH (ref 70–99)
Glucose-Capillary: 229 mg/dL — ABNORMAL HIGH (ref 70–99)
Glucose-Capillary: 181 mg/dL — ABNORMAL HIGH (ref 70–99)

## 2014-03-15 LAB — CBC
HCT: 31.6 % — ABNORMAL LOW (ref 39.0–52.0)
Hemoglobin: 10.8 g/dL — ABNORMAL LOW (ref 13.0–17.0)
MCH: 29.8 pg (ref 26.0–34.0)
MCHC: 34.2 g/dL (ref 30.0–36.0)
MCV: 87.1 fL (ref 78.0–100.0)
PLATELETS: 339 10*3/uL (ref 150–400)
RBC: 3.63 MIL/uL — AB (ref 4.22–5.81)
RDW: 13.3 % (ref 11.5–15.5)
WBC: 12.1 10*3/uL — ABNORMAL HIGH (ref 4.0–10.5)

## 2014-03-15 LAB — BASIC METABOLIC PANEL
BUN: 7 mg/dL (ref 6–23)
CALCIUM: 8.3 mg/dL — AB (ref 8.4–10.5)
CO2: 25 mEq/L (ref 19–32)
CREATININE: 0.65 mg/dL (ref 0.50–1.35)
Chloride: 103 mEq/L (ref 96–112)
GFR calc Af Amer: >90 mL/min (ref >90)
Glucose, Bld: 174 mg/dL — ABNORMAL HIGH (ref 70–99)
Potassium: 3.1 mEq/L — ABNORMAL LOW (ref 3.7–5.3)
SODIUM: 140 meq/L (ref 137–147)

## 2014-03-15 LAB — TISSUE CULTURE: Culture: NO GROWTH

## 2014-03-15 MED ORDER — SODIUM CHLORIDE 0.9 % IV SOLN: INTRAVENOUS | Status: DC

## 2014-03-15 MED ORDER — POTASSIUM CHLORIDE CRYS ER 20 MEQ PO TBCR
20.0000 meq | EXTENDED_RELEASE_TABLET | Freq: Two times a day (BID) | ORAL | Status: AC
  Administered 2014-03-15 (×2): 20 meq via ORAL
  Filled 2014-03-15 (×2): qty 1

## 2014-03-15 MED ORDER — INSULIN DETEMIR 100 UNIT/ML ~~LOC~~ SOLN
27.0000 [IU] | Freq: Two times a day (BID) | SUBCUTANEOUS | Status: DC
  Administered 2014-03-15 – 2014-03-17 (×4): 27 [IU] via SUBCUTANEOUS
  Filled 2014-03-15 (×5): qty 0.27

## 2014-03-15 MED ORDER — POTASSIUM CHLORIDE 10 MEQ/50ML IV SOLN
10.0000 meq | Freq: Once | INTRAVENOUS | Status: AC
  Administered 2014-03-15: 10 meq via INTRAVENOUS
  Filled 2014-03-15: qty 50

## 2014-03-15 MED ORDER — INSULIN PEN STARTER KIT
1.0000 | Freq: Once | Status: DC
  Filled 2014-03-15: qty 1

## 2014-03-15 MED ORDER — INSULIN ASPART 100 UNIT/ML ~~LOC~~ SOLN
5.0000 [IU] | Freq: Three times a day (TID) | SUBCUTANEOUS | Status: DC
  Administered 2014-03-15 – 2014-03-17 (×6): 5 [IU] via SUBCUTANEOUS

## 2014-03-15 NOTE — Progress Notes (Addendum)
TCTS DAILY ICU PROGRESS NOTE                   [address]            [place] [address]          [phone]   3 Days Post-Op Procedure(s) (LRB): VIDEO ASSISTED THORACOSCOPY (VATS)/EMPYEMA (Right) DECORTICATION (Right)  Total Length of Stay:  LOS: 5 days   Subjective: conts to feel better, no new complaints   Objective: Vital signs in last 24 hours: Temp:  [97.4 F (36.3 C)-98.3 F (36.8 C)] 97.9 F (36.6 C) (04/01 0400) Pulse Rate:  [97-120] 101 (04/01 0600) Cardiac Rhythm:  [-] Normal sinus rhythm;Sinus tachycardia (04/01 0600) Resp:  [18-37] 22 (04/01 0600) BP: (133-151)/(64-83) 144/82 mmHg (04/01 0600) SpO2:  [92 %-97 %] 96 % (04/01 0600) Weight:  [226 lb 3.1 oz (102.6 kg)] 226 lb 3.1 oz (102.6 kg) (04/01 0600)  Filed Weights   03/13/14 0600 03/14/14 0600 03/15/14 0600  Weight: 218 lb 7.6 oz (99.1 kg) 226 lb 3.1 oz (102.6 kg) 226 lb 3.1 oz (102.6 kg)    Weight change: 0 lb (0 kg)   Hemodynamic parameters for last 24 hours:    Intake/Output from previous day: 03/31 0701 - 04/01 0700 In: 3514.5 [P.O.:480; I.V.:1272; IV Piggyback:1762.5] Out: 6715 [Urine:6575; Chest Tube:140]  Intake/Output this shift:    Current Meds: Scheduled Meds: . bisacodyl  10 mg Oral Daily  . enoxaparin (LOVENOX) injection  40 mg Subcutaneous Q24H  . fentaNYL   Intravenous 6 times per day  . insulin aspart  0-15 Units Subcutaneous TID WC  . insulin aspart  3 Units Subcutaneous TID WC  . insulin detemir  25 Units Subcutaneous BID  . pantoprazole  40 mg Oral Daily  . piperacillin-tazobactam (ZOSYN)  IV  3.375 g Intravenous Q8H  . vancomycin  1,750 mg Intravenous Q8H   Continuous Infusions: . 0.9 % NaCl with KCl 20 mEq / L 50 mL/hr at 03/15/14 0700   PRN Meds:.diphenhydrAMINE, diphenhydrAMINE, guaiFENesin, naloxone, ondansetron (ZOFRAN) IV, ondansetron (ZOFRAN) IV, oxyCODONE-acetaminophen, potassium chloride, senna-docusate, sodium chloride, traMADol  General  appearance: alert, cooperative and no distress Heart: regular rate and rhythm Lungs: dim in right base Abdomen: benign Extremities: no edema Wound: incis healing well  Lab Results: CBC: Recent Labs  03/14/14 0420 03/15/14 0425  WBC 12.8* 12.1*  HGB 11.0* 10.8*  HCT 31.9* 31.6*  PLT 305 339   BMET:  Recent Labs  03/14/14 0420 03/15/14 0425  NA 138 140  K 3.1* 3.1*  CL 103 103  CO2 22 25  GLUCOSE 159* 174*  BUN 11 7  CREATININE 0.65 0.65  CALCIUM 8.1* 8.3*    PT/INR: No results found for this basename: LABPROT, INR,  in the last 72 hours Radiology: Dg Chest Port 1 View  03/15/2014   CLINICAL DATA:  Postoperative decortication procedure  EXAM: PORTABLE CHEST - 1 VIEW  COMPARISON:  March 14, 2014  FINDINGS: The central catheter tip is at the cavoatrial junction. There is a chest tube on the right. There is no appreciable pneumothorax.  There is persistent elevation the right hemidiaphragm with atelectatic change in the right lower lobe. Left lung is clear. Heart is prominent but stable. Pulmonary vascularity is normal. No adenopathy.  IMPRESSION: Persistent elevation of the right hemidiaphragm with right lower lobe atelectatic change. No new opacity. Left lung clear. Tube and catheter positions unchanged without appreciable pneumothorax.   Electronically Signed  By: Lowella Grip M.D.   On: 03/15/2014 07:32   Dg Chest Port 1 View  03/14/2014   CLINICAL DATA:  decortication  EXAM: PORTABLE CHEST - 1 VIEW  COMPARISON:  03/13/2014  FINDINGS: One of 2 right chest tubes has been removed. Right pleural thickening/ effusion and right lower lobe atelectasis unchanged. No pneumothorax.  Right jugular catheter tip in the SVC. Cardiac enlargement without heart failure. Left lung remains clear.  IMPRESSION: Negative for pneumothorax on the right. Right pleural thickening and right lower lobe atelectasis unchanged.   Electronically Signed   By: Franchot Gallo M.D.   On: 03/14/2014 07:44      Assessment/Plan: S/P Procedure(s) (LRB): VIDEO ASSISTED THORACOSCOPY (VATS)/EMPYEMA (Right) DECORTICATION (Right)  1 doing well , will d/c anterior  tube 2 no beds on 3 south- appears ok to go to 2 west 3 cont current abx- transition to po at discharge    [place] E 03/15/2014 8:05 AM  Looks great Drainage down- will dc posterior CT Remaining CT to water seal Hypokalemia- supplement Continue vanco and zosyn for now Transfer to 2000

## 2014-03-15 NOTE — Plan of Care (Signed)
Problem: Phase I Progression Outcomes Goal: Initial discharge plan identified Outcome: Completed/Met Date Met:  03/15/14 Home with family

## 2014-03-15 NOTE — Progress Notes (Signed)
[facility] Progress Note Patient Name: Dean Wallace DOB: Sep 01, 1971 MRN: [id number]  Date of Service  03/15/2014   HPI/Events of Note   Camera care. Mild - mod tachypnea RR 27-30. Not paradoxical. Otherwise ok, HR 100  eICU Interventions  monitor   Intervention Category Minor Interventions: Other:  Kydan Shanholtzer 03/15/2014, 12:34 AM

## 2014-03-15 NOTE — Progress Notes (Signed)
[facility] PROGRESS NOTE  Zaryan Yakubov QZR:[id number] DOB: [birth year] DOA: 03/10/2014 PCP: Crisoforo Oxford, PA-C  Assessment/Plan: Empyema-Right  -03/09/2014 thoracocentesis--Streptococcus intermedius -03/12/2014 decortication cultures--final pending -Discontinue vancomycin -Continue Zosyn pending final culture data -03/12/2014--VATS/decortication -path and cytology neg for malignancy -Appreciate CTS followup Diabetes mellitus type 2, uncontrolled -Continue Levemir, increased to 27 units twice a day -NovoLog 5 units with meals -Continue sliding scale NovoLog Sepsis -Present at the time of admission -Secondary to empyema -WBC improving Hypertension -Elevated blood pressure partly due to pain    Family Communication:   Pt at beside Disposition Plan:   Home when medically stable   Antibiotics:  Vancomycin 03/10/2014>>> 03/15/2014  Zosyn 03/10/2014>>>    Procedures/Studies: Dg Chest 1 View  03/09/2014   CLINICAL DATA:  Status post right-sided thoracentesis  EXAM: CHEST - 1 VIEW  COMPARISON:  PA and lateral chest x-ray dated March 08, 2014  FINDINGS: There remains volume loss on the right little changed from yesterday's study. There is no evidence of a pneumothorax. The left lung is adequately inflated and clear. The cardiac silhouette where visualized appears mildly enlarged. The pulmonary vascularity on the left is normal. The is obscured on the right.  IMPRESSION: There is no evidence of a postprocedure pneumothorax following the right thoracentesis. The degree of volume loss on the right is little changed since yesterday's study.   Electronically Signed   By: Lakeisha Waldrop  [place]   On: 03/09/2014 14:24   Chest 2 View  03/11/2014   CLINICAL DATA:  Preop.  EXAM: CHEST  2 VIEW  FINDINGS: Severe right lung airspace disease and right effusion again noted, not significantly changed. Heart is mildly enlarged. Left lung is clear. Low lung volumes. No acute bony  abnormality.  IMPRESSION: No significant change in the right lung opacity in right effusion.   Electronically Signed   By: Rolm Baptise M.D.   On: 03/11/2014 15:16   Dg Chest 2 View  03/08/2014   CLINICAL DATA:  Cough and back pain.  EXAM: CHEST  2 VIEW  COMPARISON:  None.  FINDINGS: There is complete consolidation of the right lower lobe. There may be a subpulmonic pleural effusion is well. Consolidation appears to extend into the right middle lobe. Left lung demonstrates mild atelectasis. Lung volumes are reduced. Mediastinal contours appear within normal limits. Cardiopericardial silhouette is partially obscured.  Followup to ensure radiographic clearing and exclude an underlying lesion is recommended. Typically clearing will be observed at 8 weeks.  IMPRESSION: Collapse/consolidation of the right lower lobe and right middle lobe. This is most commonly associated with multifocal pneumonia. If the patient does not have infectious symptoms, a chest CT with contrast is recommended for further assessment to evaluate the right lung consolidation. If the patient does have infectious symptoms, empiric antibiotic therapy and repeat imaging to ensure resolution is recommended.   Electronically Signed   By: Dereck Ligas M.D.   On: 03/08/2014 11:48   Dg Thoracic Spine W/swimmers  03/08/2014   CLINICAL DATA:  Mid back pain for 2 days. Cough for 1 week. No fever. Short of breath.  EXAM: THORACIC SPINE - 2 VIEW + SWIMMERS  COMPARISON:  None.  FINDINGS: Mild thoracic spondylosis is present. The bones are partially obscured by a consolidation in the lungs. Please see chest x-ray report. No compression fractures are identified. Moderate lower cervical degenerative disease is present. 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  IMPRESSION: Mild thoracic spondylosis without an acute osseous abnormality.   Electronically Signed  By: Dereck Ligas M.D.   On: 03/08/2014 11:49   Ct Angio Chest Pe W/cm &/or Wo Cm  03/10/2014    CLINICAL DATA:  Short of breath and cough.  Recent pneumonia  EXAM: CT ANGIOGRAPHY CHEST WITH CONTRAST  TECHNIQUE: Multidetector CT imaging of the chest was performed using the standard protocol during bolus administration of intravenous contrast. Multiplanar CT image reconstructions and MIPs were obtained to evaluate the vascular anatomy.  CONTRAST:  52m OMNIPAQUE IOHEXOL 350 MG/ML SOLN  COMPARISON:  CXR 03/09/2014  FINDINGS: Negative for pulmonary embolism.  Negative for aortic dissection or aneurysm. Heart size is normal. Mild coronary artery calcification noted. Negative for pericardial effusion.  Large loculated right pleural effusion, suspicious for empyema. Compressive atelectasis in the right lower lobe.  Negative for infiltrate or effusion in the left chest.  Fatty infiltration of the liver with no focal liver lesion identified. Spleen is normal in size.  Review of the MIP images confirms the above findings.  IMPRESSION: Negative for pulmonary embolism.  Large loculated right pleural effusion, worrisome for empyema. There is compressive atelectasis in the right lower lobe.  Fatty liver   Electronically Signed   By: CFranchot GalloM.D.   On: 03/10/2014 12:38   Dg Chest Port 1 View  03/15/2014   CLINICAL DATA:  Postoperative decortication procedure  EXAM: PORTABLE CHEST - 1 VIEW  COMPARISON:  March 14, 2014  FINDINGS: The central catheter tip is at the cavoatrial junction. There is a chest tube on the right. There is no appreciable pneumothorax.  There is persistent elevation the right hemidiaphragm with atelectatic change in the right lower lobe. Left lung is clear. Heart is prominent but stable. Pulmonary vascularity is normal. No adenopathy.  IMPRESSION: Persistent elevation of the right hemidiaphragm with right lower lobe atelectatic change. No new opacity. Left lung clear. Tube and catheter positions unchanged without appreciable pneumothorax.   Electronically Signed   By: WLowella GripM.D.   On:  03/15/2014 07:32   Dg Chest Port 1 View  03/14/2014   CLINICAL DATA:  decortication  EXAM: PORTABLE CHEST - 1 VIEW  COMPARISON:  03/13/2014  FINDINGS: One of 2 right chest tubes has been removed. Right pleural thickening/ effusion and right lower lobe atelectasis unchanged. No pneumothorax.  Right jugular catheter tip in the SVC. Cardiac enlargement without heart failure. Left lung remains clear.  IMPRESSION: Negative for pneumothorax on the right. Right pleural thickening and right lower lobe atelectasis unchanged.   Electronically Signed   By: CFranchot GalloM.D.   On: 03/14/2014 07:44   Dg Chest Port 1 View  03/13/2014   CLINICAL DATA:  Status post VATS for right empyema.  EXAM: PORTABLE CHEST - 1 VIEW  COMPARISON:  DG CHEST 1V PORT dated 03/12/2014; DG CHEST 2 VIEW dated 03/11/2014; CT ANGIO CHEST W/CM &/OR WO/CM dated 03/10/2014  FINDINGS: Stable positioning of 2 separate right chest tubes. No pneumothorax or significant reaccumulation of right pleural fluid is identified. There is stable significant volume loss of the right lung with marginally improved aeration. Aeration of the left lung has improved since prior study. Stable cardiomegaly.  IMPRESSION: Improved aeration. No right-sided pneumothorax or pleural fluid reaccumulation.   Electronically Signed   By: GAletta EdouardM.D.   On: 03/13/2014 07:18   Dg Chest Portable 1 View  03/12/2014   CLINICAL DATA:  Post VATS for empyema.  EXAM: PORTABLE CHEST - 1 VIEW  COMPARISON:  03/11/2014  FINDINGS: There appears to be  three right-sided chest tubes. There are patchy densities throughout the right lung. Overall, there are low lung volumes. Right jugular central line has been placed. Catheter tip in lower SVC. No evidence for a pneumothorax. Heart size is grossly stable.  IMPRESSION: Central venous catheter tip in the lower SVC.  Right chest tubes without a large pneumothorax.  Patchy densities throughout the right lung.   Electronically Signed   By: Markus Daft M.D.   On: 03/12/2014 13:22   US Thoracentesis Asp Pleural Space W/img Guide  03/09/2014   CLINICAL DATA:  Pneumonia, cough, right pleural effusion. Request is made for diagnostic/therapeutic right thoracentesis  EXAM: ULTRASOUND GUIDED DIAGNOSTIC RIGHT THORACENTESIS  COMPARISON:  None.  PROCEDURE: An ultrasound guided thoracentesis was thoroughly discussed with the patient and questions answered. The benefits, risks, alternatives and complications were also discussed. The patient understands and wishes to proceed with the procedure. Written consent was obtained.  Ultrasound was performed to localize and mark an adequate pocket of fluid in the right chest. The area was then prepped and draped in the normal sterile fashion. 1% Lidocaine was used for local anesthesia. Under ultrasound guidance a 19 gauge Yueh catheter was introduced. Thoracentesis was performed. The catheter was removed and a dressing applied.  Complications:  None.  FINDINGS: A total of approximately 140 cc's of turbid, yellow fluid was removed. The fluid sample wassent for laboratory analysis. The pleural fluid collection was multiloculated in nature. Only the above amount of fluid could be removed at this time.  IMPRESSION: Successful ultrasound guided diagnostic right thoracentesis yielding 140 cc's of pleural fluid. Pleural fluid collection appears multiloculated on ultrasound imaging. Recommend follow-up CT chest for further evaluation.  Read by: Rowe Robert ,P.A.-C.   Electronically Signed   By: Daryll Brod M.D.   On: 03/09/2014 14:43         Subjective: Patient is feeling better. Denies any fevers, chills, shortness breath, nausea, vomiting, diarrhea he still has some chest pain at the chest tube site. No abdominal pain or dysuria.  Objective: Filed Vitals:   03/15/14 0828 03/15/14 0900 03/15/14 1106 03/15/14 1401  BP:  154/88 154/82 150/80  Pulse:  103 101 100  Temp: 97.5 F (36.4 C)  98.3 F (36.8 C) 98.1 F  (36.7 C)  TempSrc: Oral  Oral Oral  Resp:  22 20 18   Height:      Weight:      SpO2:  94% 96% 95%    Intake/Output Summary (Last 24 hours) at 03/15/14 1421 Last data filed at 03/15/14 1352  Gross per 24 hour  Intake 3233.5 ml  Output   8815 ml  Net -5581.5 ml   Weight change: 0 kg (0 lb) Exam:   General:  Pt is alert, follows commands appropriately, not in acute distress  HEENT: No icterus, No thrush,[place]/AT  Cardiovascular: RRR, S1/S2, no rubs, no gallops  Respiratory: Left clear to auscultation; right crackles; no wheezing  Abdomen: Soft/+BS, non tender, non distended, no guarding  Extremities: trace LE edema, No lymphangitis, No petechiae, No rashes, no synovitis  Data Reviewed: Basic Metabolic Panel:  Recent Labs Lab 03/11/14 2141 03/12/14 0345 03/12/14 1017 03/12/14 1134 03/13/14 0400 03/14/14 0420 03/15/14 0425  NA 131* 133*  --   --  139 138 140  K 4.4 4.2  --   --  3.6* 3.1* 3.1*  CL 95* 96  --   --  107 103 103  CO2 15* 15*  --   --  20 22 25   GLUCOSE 350* 323* 342* 304* 165* 159* 174*  BUN 10 11  --   --  13 11 7   CREATININE 0.63 0.62  --   --  0.65 0.65 0.65  CALCIUM 9.7 9.7  --   --  8.0* 8.1* 8.3*   Liver Function Tests:  Recent Labs Lab 03/10/14 2050 03/11/14 0440 03/11/14 1600 03/14/14 0420  AST 13 12 13 25   ALT 22 22 25  50  ALKPHOS 81 88 96 68  BILITOT 0.5 0.5 0.5 0.3  PROT 6.9 7.1 7.7 4.9*  ALBUMIN 2.5* 2.6* 2.6* 1.6*   No results found for this basename: LIPASE, AMYLASE,  in the last 168 hours No results found for this basename: AMMONIA,  in the last 168 hours CBC:  Recent Labs Lab 03/10/14 2050  03/11/14 1600 03/12/14 0345 03/13/14 0400 03/14/14 0420 03/15/14 0425  WBC 20.0*  < > 24.1* 21.0* 19.7* 12.8* 12.1*  NEUTROABS 16.7*  --   --  17.9*  --   --   --   HGB 14.4  < > 15.7 15.2 11.5* 11.0* 10.8*  HCT 40.6  < > 44.7 43.0 33.3* 31.9* 31.6*  MCV 86.0  < > 87.3 87.2 86.9 86.2 87.1  PLT 336  < > 412* 354 292 305 339    < > = values in this interval not displayed. Cardiac Enzymes: No results found for this basename: CKTOTAL, CKMB, CKMBINDEX, TROPONINI,  in the last 168 hours BNP: No components found with this basename: POCBNP,  CBG:  Recent Labs Lab 03/14/14 1205 03/14/14 1706 03/14/14 2133 03/15/14 0823 03/15/14 1143  GLUCAP 143* 161* 184* 150* 194*    Recent Results (from the past 240 hour(s))  BODY FLUID CULTURE     Status: None   Collection Time    03/09/14  2:24 PM      Result Value Ref Range Status   Specimen Description PLEURAL   Final   Special Requests NONE   Final   Gram Stain     Final   Value: FEW WBC PRESENT,BOTH PMN AND MONONUCLEAR     NO ORGANISMS SEEN     Performed at [facility]   Culture     Final   Value: MODERATE STREPTOCOCCUS INTERMEDIUS     Performed at [facility]   Report Status 03/12/2014 FINAL   Final   Organism ID, Bacteria STREPTOCOCCUS INTERMEDIUS   Final  CULTURE, BLOOD (ROUTINE X 2)     Status: None   Collection Time    03/10/14  8:40 PM      Result Value Ref Range Status   Specimen Description BLOOD RIGHT ARM   Final   Special Requests BOTTLES DRAWN AEROBIC ONLY 5CC   Final   Culture  Setup Time     Final   Value: 03/10/2014 23:55     Performed at [facility]   Culture     Final   Value:        BLOOD CULTURE RECEIVED NO GROWTH TO DATE CULTURE WILL BE HELD FOR 5 DAYS BEFORE ISSUING A FINAL NEGATIVE REPORT     Performed at [facility]   Report Status PENDING   Incomplete  CULTURE, BLOOD (ROUTINE X 2)     Status: None   Collection Time    03/10/14  8:50 PM      Result Value Ref Range Status   Specimen Description BLOOD LEFT HAND   Final   Special  Requests BOTTLES DRAWN AEROBIC ONLY [facility]   Final   Culture  Setup Time     Final   Value: 03/10/2014 23:56     Performed at [facility]   Culture     Final   Value:        BLOOD CULTURE RECEIVED NO GROWTH TO DATE CULTURE WILL BE HELD FOR 5 DAYS BEFORE ISSUING A  FINAL NEGATIVE REPORT     Performed at [facility]   Report Status PENDING   Incomplete  SURGICAL PCR SCREEN     Status: None   Collection Time    03/12/14  4:01 AM      Result Value Ref Range Status   MRSA, PCR NEGATIVE  NEGATIVE Final   Staphylococcus aureus NEGATIVE  NEGATIVE Final   Comment:            The Xpert SA Assay (FDA     approved for NASAL specimens     in patients over 36 years of age),     is one component of     a comprehensive surveillance     program.  Test performance has     been validated by [employer] for patients greater     than or equal to 24 year old.     It is not intended     to diagnose infection nor to     guide or monitor treatment.  BODY FLUID CULTURE     Status: None   Collection Time    03/12/14 10:14 AM      Result Value Ref Range Status   Specimen Description FLUID RIGHT PLEURAL   Final   Special Requests PATIENT ON FOLLOWING VANCO ADN ZOSYN NO1   Final   Gram Stain     Final   Value: WBC PRESENT, PREDOMINANTLY PMN     ABUNDANT GRAM POSITIVE COCCI IN PAIRS     IN CHAINS ABUNDANT GRAM NEGATIVE COCCOBACILLI     Performed at [facility]   Culture     Final   Value: Culture reincubated for better growth     Performed at [facility]   Report Status PENDING   Incomplete  TISSUE CULTURE     Status: None   Collection Time    03/12/14 10:19 AM      Result Value Ref Range Status   Specimen Description TISSUE   Final   Special Requests     Final   Value: PATIENT ON FOLLOWING VANCO AND ZOSYN RIGHT PLEURAL PEEL   Gram Stain     Final   Value: FEW WBC PRESENT, PREDOMINANTLY PMN     RARE SQUAMOUS EPITHELIAL CELLS PRESENT     NO ORGANISMS SEEN     Performed at [facility]   Culture     Final   Value: NO GROWTH 3 DAYS     Performed at [facility]   Report Status 03/15/2014 FINAL   Final  ANAEROBIC CULTURE     Status: None   Collection Time    03/12/14 10:19 AM      Result Value Ref Range  Status   Specimen Description TISSUE   Final   Special Requests     Final   Value: PATIENT ON FOLLOWING VANCO AND ZOSYN RIGHT PLEURAL PEEL   Gram Stain     Final   Value: FEW WBC PRESENT, PREDOMINANTLY PMN     RARE SQUAMOUS EPITHELIAL CELLS  PRESENT     NO ORGANISMS SEEN     Performed at [facility]   Culture     Final   Value: NO ANAEROBES ISOLATED; CULTURE IN PROGRESS FOR 5 DAYS     Performed at [facility]   Report Status PENDING   Incomplete     Scheduled Meds: . bisacodyl  10 mg Oral Daily  . enoxaparin (LOVENOX) injection  40 mg Subcutaneous Q24H  . fentaNYL   Intravenous 6 times per day  . insulin aspart  0-15 Units Subcutaneous TID WC  . insulin aspart  5 Units Subcutaneous TID WC  . insulin detemir  27 Units Subcutaneous BID  . pantoprazole  40 mg Oral Daily  . piperacillin-tazobactam (ZOSYN)  IV  3.375 g Intravenous Q8H  . potassium chloride  20 mEq Oral BID   Continuous Infusions: . sodium chloride    . 0.9 % NaCl with KCl 20 mEq / L 20 mL/hr (03/15/14 0856)     Ja Pistole, DO  [facility] Pager [phone]  If 7PM-7AM, please contact night-coverage [email or web address] Password [email or web address] 03/15/2014, 2:21 PM   LOS: 5 days

## 2014-03-15 NOTE — Progress Notes (Signed)
Spoke to patient regarding insulin use at home.  Demonstrated use of insulin pen to patient and partner.  Both were able to demonstrate proper use of insulin pen including 2 unit prime, proper injection technique, and how to dial up dose. Explained basal insulin and bolus insulin and how each works.  Also discussed briefly diet modifications including the plate method.  They are interested in more information regarding diet/meal planning. Outpatient diabetes education ordered per protocol.  Will follow-up tomorrow.    Adah Perl, RN, BC-ADM Inpatient Diabetes Coordinator Pager [phone]

## 2014-03-16 ENCOUNTER — Inpatient Hospital Stay (HOSPITAL_COMMUNITY): Payer: 59

## 2014-03-16 DIAGNOSIS — J90 Pleural effusion, not elsewhere classified: Secondary | ICD-10-CM

## 2014-03-16 LAB — CULTURE, BLOOD (ROUTINE X 2)
CULTURE: NO GROWTH
Culture: NO GROWTH

## 2014-03-16 LAB — BASIC METABOLIC PANEL
BUN: 6 mg/dL (ref 6–23)
CALCIUM: 8.7 mg/dL (ref 8.4–10.5)
CO2: 31 mEq/L (ref 19–32)
Chloride: 100 mEq/L (ref 96–112)
Creatinine, Ser: 0.68 mg/dL (ref 0.50–1.35)
GLUCOSE: 124 mg/dL — AB (ref 70–99)
POTASSIUM: 3.4 meq/L — AB (ref 3.7–5.3)
SODIUM: 143 meq/L (ref 137–147)

## 2014-03-16 LAB — CBC
HCT: 33.9 % — ABNORMAL LOW (ref 39.0–52.0)
HEMOGLOBIN: 11.4 g/dL — AB (ref 13.0–17.0)
MCH: 29.8 pg (ref 26.0–34.0)
MCHC: 33.6 g/dL (ref 30.0–36.0)
MCV: 88.5 fL (ref 78.0–100.0)
PLATELETS: 406 10*3/uL — AB (ref 150–400)
RBC: 3.83 MIL/uL — ABNORMAL LOW (ref 4.22–5.81)
RDW: 13.4 % (ref 11.5–15.5)
WBC: 12.4 10*3/uL — ABNORMAL HIGH (ref 4.0–10.5)

## 2014-03-16 LAB — BODY FLUID CULTURE

## 2014-03-16 LAB — GLUCOSE, CAPILLARY
GLUCOSE-CAPILLARY: 127 mg/dL — AB (ref 70–99)
GLUCOSE-CAPILLARY: 208 mg/dL — AB (ref 70–99)
Glucose-Capillary: 139 mg/dL — ABNORMAL HIGH (ref 70–99)
Glucose-Capillary: 156 mg/dL — ABNORMAL HIGH (ref 70–99)

## 2014-03-16 MED ORDER — AMOXICILLIN-POT CLAVULANATE 875-125 MG PO TABS
1.0000 | ORAL_TABLET | Freq: Two times a day (BID) | ORAL | Status: DC
  Administered 2014-03-16 – 2014-03-17 (×2): 1 via ORAL
  Filled 2014-03-16 (×3): qty 1

## 2014-03-16 MED ORDER — SODIUM CHLORIDE 0.9 % IJ SOLN
10.0000 mL | Freq: Two times a day (BID) | INTRAMUSCULAR | Status: DC
  Administered 2014-03-16 – 2014-03-17 (×2): 10 mL

## 2014-03-16 MED ORDER — POTASSIUM CHLORIDE CRYS ER 20 MEQ PO TBCR
40.0000 meq | EXTENDED_RELEASE_TABLET | Freq: Once | ORAL | Status: AC
  Administered 2014-03-16: 40 meq via ORAL
  Filled 2014-03-16: qty 2

## 2014-03-16 MED ORDER — INSULIN DETEMIR 100 UNIT/ML FLEXPEN: 27.0000 [IU] | PEN_INJECTOR | Freq: Two times a day (BID) | SUBCUTANEOUS | Status: DC

## 2014-03-16 MED ORDER — SODIUM CHLORIDE 0.9 % IJ SOLN
10.0000 mL | INTRAMUSCULAR | Status: DC | PRN
  Administered 2014-03-16 – 2014-03-17 (×3): 10 mL

## 2014-03-16 MED ORDER — INSULIN ASPART 100 UNIT/ML FLEXPEN: 5.0000 [IU] | PEN_INJECTOR | Freq: Three times a day (TID) | SUBCUTANEOUS | Status: DC

## 2014-03-16 MED ORDER — OXYCODONE-ACETAMINOPHEN 5-325 MG PO TABS: 1.0000 | ORAL_TABLET | ORAL | Status: DC | PRN

## 2014-03-16 MED ORDER — AMOXICILLIN-POT CLAVULANATE 875-125 MG PO TABS: 1.0000 | ORAL_TABLET | Freq: Two times a day (BID) | ORAL | Status: DC

## 2014-03-16 NOTE — Progress Notes (Signed)
Inpatient Diabetes Program Recommendations  AACE/ADA: New Consensus Statement on Inpatient Glycemic Control (2013)  Target Ranges:  Prepandial:   less than 140 mg/dL      Peak postprandial:   less than 180 mg/dL (1-2 hours)      Critically ill patients:  140 - 180 mg/dL   Reason for Visit: Took patient information regarding label reading, CHO counting and healthy eating.  Also reinforced teaching on insulin and hypoglycemia S&S and treatment.  At discharge he will need Rx. For glucose meter, insulin pens (Levemir Flextouch and Novolog Flex pen), and insulin pen needles.  He plans to follow-up with Dr. Glade Lloyd after hospitalization regarding his diabetes.      Adah Perl, RN, BC-ADM Inpatient Diabetes Coordinator Pager [phone]

## 2014-03-16 NOTE — Progress Notes (Addendum)
      [address]       [place] [address]             [phone]       4 Days Post-Op Procedure(s) (LRB): VIDEO ASSISTED THORACOSCOPY (VATS)/EMPYEMA (Right) DECORTICATION (Right)  Subjective: Patient without specific complaints  Objective: Vital signs in last 24 hours: Temp:  [97.5 F (36.4 C)-99.7 F (37.6 C)] 98 F (36.7 C) (04/02 0457) Pulse Rate:  [97-114] 97 (04/02 0457) Cardiac Rhythm:  [-] Normal sinus rhythm;Sinus tachycardia (04/01 2000) Resp:  [12-27] 16 (04/02 0457) BP: (136-154)/(80-88) 136/85 mmHg (04/02 0457) SpO2:  [90 %-98 %] 98 % (04/02 0457) FiO2 (%):  [94 %] 94 % (04/01 0806)     Intake/Output from previous day: 04/01 0701 - 04/02 0700 In: 1414.9 [P.O.:360; I.V.:804.9; IV Piggyback:250] Out: 6125 [Urine:6125]   Physical Exam:  Cardiovascular: Tachycardic Pulmonary: Clear on left and diminished at right base; no rales, wheezes, or rhonchi. Abdomen: Soft, non tender, bowel sounds present. Wounds: Clean and dry.  No erythema or signs of infection.  Lab Results: CBC: Recent Labs  03/15/14 0425 03/16/14 0522  WBC 12.1* 12.4*  HGB 10.8* 11.4*  HCT 31.6* 33.9*  PLT 339 406*   BMET:  Recent Labs  03/15/14 0425 03/16/14 0522  NA 140 143  K 3.1* 3.4*  CL 103 100  CO2 25 31  GLUCOSE 174* 124*  BUN 7 6  CREATININE 0.65 0.68  CALCIUM 8.3* 8.7    PT/INR: No results found for this basename: LABPROT, INR,  in the last 72 hours ABG:  INR: Will add last result for INR, ABG once components are confirmed Will add last 4 CBG results once components are confirmed  Assessment/Plan:  1. CV - SR/ST 2.  Pulmonary - Chest tube removed yesterday. CXR shows small to moderate right consolidation, no pneumothorax. Now on Augmentin.Encourage incentive spirometer and flutter valve. 3. Mild ABL anemia-H and H stable at 11 and 33.6 4. DM-management per medicine 5. Follow up appointment arranged   Taheerah Guldin  MPA-C 03/16/2014,7:47 AM

## 2014-03-16 NOTE — Progress Notes (Addendum)
Waste 413mg/41ml of fentanyl pca. Witnessed by JRandol Kern RN.

## 2014-03-16 NOTE — Progress Notes (Signed)
Pulled rt CT. No s/s of distress novoiced complaints. Applied vaseline gauze to site, tied sutures. Pt tolerated well. Pt has CXRAY ordered in am. Monitoring will continue.

## 2014-03-16 NOTE — Progress Notes (Signed)
[facility] PROGRESS NOTE  Dean Wallace VQM:[id number] DOB: 01/07/1971 DOA: 03/10/2014 PCP: Crisoforo Oxford, PA-C  Assessment/Plan: Empyema-Right  -03/09/2014 thoracocentesis--Streptococcus intermedius  -03/12/2014 decortication cultures--viridans strep -Discontinue vancomycin  -plan Augmentin x 3 weeks post surgery--stop date 04/03/14 -03/12/2014--VATS/decortication  -path and cytology neg for malignancy  -Appreciate CTS followup  Diabetes mellitus type 2, uncontrolled  -Continue Levemir, increased to 27 units twice a day  -NovoLog 5 units with meals  -Continue sliding scale NovoLog  Sepsis  -Present at the time of admission  -Secondary to empyema  -WBC improving  Hypertension  -Elevated blood pressure partly due to pain   Family Communication:   sister at beside Disposition Plan:   Home 03/17/14       Procedures/Studies: Dg Chest 1 View  03/09/2014   CLINICAL DATA:  Status post right-sided thoracentesis  EXAM: CHEST - 1 VIEW  COMPARISON:  PA and lateral chest x-ray dated March 08, 2014  FINDINGS: There remains volume loss on the right little changed from yesterday's study. There is no evidence of a pneumothorax. The left lung is adequately inflated and clear. The cardiac silhouette where visualized appears mildly enlarged. The pulmonary vascularity on the left is normal. The is obscured on the right.  IMPRESSION: There is no evidence of a postprocedure pneumothorax following the right thoracentesis. The degree of volume loss on the right is little changed since yesterday's study.   Electronically Signed   By: Haruto Demaria  [place]   On: 03/09/2014 14:24   Chest 2 View  03/11/2014   CLINICAL DATA:  Preop.  EXAM: CHEST  2 VIEW  FINDINGS: Severe right lung airspace disease and right effusion again noted, not significantly changed. Heart is mildly enlarged. Left lung is clear. Low lung volumes. No acute bony abnormality.  IMPRESSION: No significant change in the right lung  opacity in right effusion.   Electronically Signed   By: Rolm Baptise M.D.   On: 03/11/2014 15:16   Dg Chest 2 View  03/08/2014   CLINICAL DATA:  Cough and back pain.  EXAM: CHEST  2 VIEW  COMPARISON:  None.  FINDINGS: There is complete consolidation of the right lower lobe. There may be a subpulmonic pleural effusion is well. Consolidation appears to extend into the right middle lobe. Left lung demonstrates mild atelectasis. Lung volumes are reduced. Mediastinal contours appear within normal limits. Cardiopericardial silhouette is partially obscured.  Followup to ensure radiographic clearing and exclude an underlying lesion is recommended. Typically clearing will be observed at 8 weeks.  IMPRESSION: Collapse/consolidation of the right lower lobe and right middle lobe. This is most commonly associated with multifocal pneumonia. If the patient does not have infectious symptoms, a chest CT with contrast is recommended for further assessment to evaluate the right lung consolidation. If the patient does have infectious symptoms, empiric antibiotic therapy and repeat imaging to ensure resolution is recommended.   Electronically Signed   By: Dereck Ligas M.D.   On: 03/08/2014 11:48   Dg Thoracic Spine W/swimmers  03/08/2014   CLINICAL DATA:  Mid back pain for 2 days. Cough for 1 week. No fever. Short of breath.  EXAM: THORACIC SPINE - 2 VIEW + SWIMMERS  COMPARISON:  None.  FINDINGS: Mild thoracic spondylosis is present. The bones are partially obscured by a consolidation in the lungs. Please see chest x-ray report. No compression fractures are identified. Moderate lower cervical degenerative disease is present. 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  IMPRESSION: Mild thoracic spondylosis without an  acute osseous abnormality.   Electronically Signed   By: Dereck Ligas M.D.   On: 03/08/2014 11:49   Ct Angio Chest Pe W/cm &/or Wo Cm  03/10/2014   CLINICAL DATA:  Short of breath and cough.  Recent pneumonia  EXAM:  CT ANGIOGRAPHY CHEST WITH CONTRAST  TECHNIQUE: Multidetector CT imaging of the chest was performed using the standard protocol during bolus administration of intravenous contrast. Multiplanar CT image reconstructions and MIPs were obtained to evaluate the vascular anatomy.  CONTRAST:  23m OMNIPAQUE IOHEXOL 350 MG/ML SOLN  COMPARISON:  CXR 03/09/2014  FINDINGS: Negative for pulmonary embolism.  Negative for aortic dissection or aneurysm. Heart size is normal. Mild coronary artery calcification noted. Negative for pericardial effusion.  Large loculated right pleural effusion, suspicious for empyema. Compressive atelectasis in the right lower lobe.  Negative for infiltrate or effusion in the left chest.  Fatty infiltration of the liver with no focal liver lesion identified. Spleen is normal in size.  Review of the MIP images confirms the above findings.  IMPRESSION: Negative for pulmonary embolism.  Large loculated right pleural effusion, worrisome for empyema. There is compressive atelectasis in the right lower lobe.  Fatty liver   Electronically Signed   By: CFranchot GalloM.D.   On: 03/10/2014 12:38   Dg Chest Port 1 View  03/16/2014   CLINICAL DATA:  Post decortication, evaluate right-sided chest tube  EXAM: PORTABLE CHEST - 1 VIEW  COMPARISON:  DG CHEST 1V PORT dated 03/15/2014; DG CHEST 1 VIEW dated 03/09/2014; CT ANGIO CHEST W/CM &/OR WO/CM dated 03/10/2014; DG CHEST 2 VIEW dated 03/11/2014  FINDINGS: Grossly unchanged enlarged cardiac silhouette and mediastinal contours. Stable position of support apparatus. No pneumothorax, though examination minimally degraded secondary to patient's overlying chin. Small to moderate size right-sided effusion with associated right basilar heterogeneous as consolidative opacities are unchanged. There is mild pulmonary venous congestion within the aerated portion of the right lung. No new focal airspace opacities. No new focal airspace opacities unchanged bones.  IMPRESSION: 1.   Stable positioning of support apparatus.  No pneumothorax. 2. Unchanged small to moderate sized right-sided effusion and associated right basilar heterogeneous/consolidative opacities worrisome for residual infection.   Electronically Signed   By: JSandi MariscalM.D.   On: 03/16/2014 07:49   Dg Chest Port 1 View  03/15/2014   CLINICAL DATA:  Postoperative decortication procedure  EXAM: PORTABLE CHEST - 1 VIEW  COMPARISON:  March 14, 2014  FINDINGS: The central catheter tip is at the cavoatrial junction. There is a chest tube on the right. There is no appreciable pneumothorax.  There is persistent elevation the right hemidiaphragm with atelectatic change in the right lower lobe. Left lung is clear. Heart is prominent but stable. Pulmonary vascularity is normal. No adenopathy.  IMPRESSION: Persistent elevation of the right hemidiaphragm with right lower lobe atelectatic change. No new opacity. Left lung clear. Tube and catheter positions unchanged without appreciable pneumothorax.   Electronically Signed   By: WLowella GripM.D.   On: 03/15/2014 07:32   Dg Chest Port 1 View  03/14/2014   CLINICAL DATA:  decortication  EXAM: PORTABLE CHEST - 1 VIEW  COMPARISON:  03/13/2014  FINDINGS: One of 2 right chest tubes has been removed. Right pleural thickening/ effusion and right lower lobe atelectasis unchanged. No pneumothorax.  Right jugular catheter tip in the SVC. Cardiac enlargement without heart failure. Left lung remains clear.  IMPRESSION: Negative for pneumothorax on the right. Right pleural thickening and right  lower lobe atelectasis unchanged.   Electronically Signed   By: Franchot Gallo M.D.   On: 03/14/2014 07:44   Dg Chest Port 1 View  03/13/2014   CLINICAL DATA:  Status post VATS for right empyema.  EXAM: PORTABLE CHEST - 1 VIEW  COMPARISON:  DG CHEST 1V PORT dated 03/12/2014; DG CHEST 2 VIEW dated 03/11/2014; CT ANGIO CHEST W/CM &/OR WO/CM dated 03/10/2014  FINDINGS: Stable positioning of 2 separate  right chest tubes. No pneumothorax or significant reaccumulation of right pleural fluid is identified. There is stable significant volume loss of the right lung with marginally improved aeration. Aeration of the left lung has improved since prior study. Stable cardiomegaly.  IMPRESSION: Improved aeration. No right-sided pneumothorax or pleural fluid reaccumulation.   Electronically Signed   By: Aletta Edouard M.D.   On: 03/13/2014 07:18   Dg Chest Portable 1 View  03/12/2014   CLINICAL DATA:  Post VATS for empyema.  EXAM: PORTABLE CHEST - 1 VIEW  COMPARISON:  03/11/2014  FINDINGS: There appears to be three right-sided chest tubes. There are patchy densities throughout the right lung. Overall, there are low lung volumes. Right jugular central line has been placed. Catheter tip in lower SVC. No evidence for a pneumothorax. Heart size is grossly stable.  IMPRESSION: Central venous catheter tip in the lower SVC.  Right chest tubes without a large pneumothorax.  Patchy densities throughout the right lung.   Electronically Signed   By: Markus Daft M.D.   On: 03/12/2014 13:22   US Thoracentesis Asp Pleural Space W/img Guide  03/09/2014   CLINICAL DATA:  Pneumonia, cough, right pleural effusion. Request is made for diagnostic/therapeutic right thoracentesis  EXAM: ULTRASOUND GUIDED DIAGNOSTIC RIGHT THORACENTESIS  COMPARISON:  None.  PROCEDURE: An ultrasound guided thoracentesis was thoroughly discussed with the patient and questions answered. The benefits, risks, alternatives and complications were also discussed. The patient understands and wishes to proceed with the procedure. Written consent was obtained.  Ultrasound was performed to localize and mark an adequate pocket of fluid in the right chest. The area was then prepped and draped in the normal sterile fashion. 1% Lidocaine was used for local anesthesia. Under ultrasound guidance a 19 gauge Yueh catheter was introduced. Thoracentesis was performed. The catheter  was removed and a dressing applied.  Complications:  None.  FINDINGS: A total of approximately 140 cc's of turbid, yellow fluid was removed. The fluid sample wassent for laboratory analysis. The pleural fluid collection was multiloculated in nature. Only the above amount of fluid could be removed at this time.  IMPRESSION: Successful ultrasound guided diagnostic right thoracentesis yielding 140 cc's of pleural fluid. Pleural fluid collection appears multiloculated on ultrasound imaging. Recommend follow-up CT chest for further evaluation.  Read by: Rowe Robert ,P.A.-C.   Electronically Signed   By: Daryll Brod M.D.   On: 03/09/2014 14:43         Subjective: Patient denies any fevers, chills, chest discomfort, shortness breath, nausea, vomiting, diarrhea, abdominal pain, dysuria, hematuria.  Objective: Filed Vitals:   03/16/14 0400 03/16/14 0457 03/16/14 0800 03/16/14 1356  BP:  136/85  130/78  Pulse:  97  95  Temp:  98 F (36.7 C)  98.6 F (37 C)  TempSrc:  Oral  Oral  Resp: 20 16 20 20   Height:      Weight:      SpO2: 94% 98% 96% 97%    Intake/Output Summary (Last 24 hours) at 03/16/14 1420 Last data filed at  03/16/14 1300  Gross per 24 hour  Intake 1456.93 ml  Output   3900 ml  Net -2443.07 ml   Weight change:  Exam:   General:  Pt is alert, follows commands appropriately, not in acute distress  HEENT: No icterus, No thrush, No neck mass, [place]/AT  Cardiovascular: RRR, S1/S2, no rubs, no gallops  Respiratory: Bibasilar crackles. Left clear to auscultation. No wheezing. Good air movement.  Abdomen: Soft/+BS, non tender, non distended, no guarding  Extremities: No edema, No lymphangitis, No petechiae, No rashes, no synovitis  Data Reviewed: Basic Metabolic Panel:  Recent Labs Lab 03/12/14 0345  03/12/14 1134 03/13/14 0400 03/14/14 0420 03/15/14 0425 03/16/14 0522  NA 133*  --   --  139 138 140 143  K 4.2  --   --  3.6* 3.1* 3.1* 3.4*  CL 96  --   --  107  103 103 100  CO2 15*  --   --  20 22 25 31   GLUCOSE 323*  < > 304* 165* 159* 174* 124*  BUN 11  --   --  13 11 7 6   CREATININE 0.62  --   --  0.65 0.65 0.65 0.68  CALCIUM 9.7  --   --  8.0* 8.1* 8.3* 8.7  < > = values in this interval not displayed. Liver Function Tests:  Recent Labs Lab 03/10/14 2050 03/11/14 0440 03/11/14 1600 03/14/14 0420  AST 13 12 13 25   ALT 22 22 25  50  ALKPHOS 81 88 96 68  BILITOT 0.5 0.5 0.5 0.3  PROT 6.9 7.1 7.7 4.9*  ALBUMIN 2.5* 2.6* 2.6* 1.6*   No results found for this basename: LIPASE, AMYLASE,  in the last 168 hours No results found for this basename: AMMONIA,  in the last 168 hours CBC:  Recent Labs Lab 03/10/14 2050  03/12/14 0345 03/13/14 0400 03/14/14 0420 03/15/14 0425 03/16/14 0522  WBC 20.0*  < > 21.0* 19.7* 12.8* 12.1* 12.4*  NEUTROABS 16.7*  --  17.9*  --   --   --   --   HGB 14.4  < > 15.2 11.5* 11.0* 10.8* 11.4*  HCT 40.6  < > 43.0 33.3* 31.9* 31.6* 33.9*  MCV 86.0  < > 87.2 86.9 86.2 87.1 88.5  PLT 336  < > 354 292 305 339 406*  < > = values in this interval not displayed. Cardiac Enzymes: No results found for this basename: CKTOTAL, CKMB, CKMBINDEX, TROPONINI,  in the last 168 hours BNP: No components found with this basename: POCBNP,  CBG:  Recent Labs Lab 03/15/14 1143 03/15/14 1616 03/15/14 1957 03/16/14 0610 03/16/14 1132  GLUCAP 194* 229* 181* 127* 139*    Recent Results (from the past 240 hour(s))  BODY FLUID CULTURE     Status: None   Collection Time    03/09/14  2:24 PM      Result Value Ref Range Status   Specimen Description PLEURAL   Final   Special Requests NONE   Final   Gram Stain     Final   Value: FEW WBC PRESENT,BOTH PMN AND MONONUCLEAR     NO ORGANISMS SEEN     Performed at [facility]   Culture     Final   Value: MODERATE STREPTOCOCCUS INTERMEDIUS     Performed at [facility]   Report Status 03/12/2014 FINAL   Final   Organism ID, Bacteria STREPTOCOCCUS  INTERMEDIUS   Final  CULTURE, BLOOD (ROUTINE X 2)  Status: None   Collection Time    03/10/14  8:40 PM      Result Value Ref Range Status   Specimen Description BLOOD RIGHT ARM   Final   Special Requests BOTTLES DRAWN AEROBIC ONLY 5CC   Final   Culture  Setup Time     Final   Value: 03/10/2014 23:55     Performed at [facility]   Culture     Final   Value: NO GROWTH 5 DAYS     Performed at [facility]   Report Status 03/16/2014 FINAL   Final  CULTURE, BLOOD (ROUTINE X 2)     Status: None   Collection Time    03/10/14  8:50 PM      Result Value Ref Range Status   Specimen Description BLOOD LEFT HAND   Final   Special Requests BOTTLES DRAWN AEROBIC ONLY [facility]   Final   Culture  Setup Time     Final   Value: 03/10/2014 23:56     Performed at [facility]   Culture     Final   Value: NO GROWTH 5 DAYS     Performed at [facility]   Report Status 03/16/2014 FINAL   Final  SURGICAL PCR SCREEN     Status: None   Collection Time    03/12/14  4:01 AM      Result Value Ref Range Status   MRSA, PCR NEGATIVE  NEGATIVE Final   Staphylococcus aureus NEGATIVE  NEGATIVE Final   Comment:            The Xpert SA Assay (FDA     approved for NASAL specimens     in patients over 74 years of age),     is one component of     a comprehensive surveillance     program.  Test performance has     been validated by [employer] for patients greater     than or equal to 9 year old.     It is not intended     to diagnose infection nor to     guide or monitor treatment.  BODY FLUID CULTURE     Status: None   Collection Time    03/12/14 10:14 AM      Result Value Ref Range Status   Specimen Description FLUID RIGHT PLEURAL   Final   Special Requests PATIENT ON FOLLOWING VANCO ADN ZOSYN NO1   Final   Gram Stain     Final   Value: WBC PRESENT, PREDOMINANTLY PMN     ABUNDANT GRAM POSITIVE COCCI IN PAIRS     IN CHAINS ABUNDANT GRAM NEGATIVE COCCOBACILLI      Performed at [facility]   Culture     Final   Value: MODERATE VIRIDANS STREPTOCOCCUS     Performed at [facility]   Report Status 03/16/2014 FINAL   Final   Organism ID, Bacteria VIRIDANS STREPTOCOCCUS   Final  TISSUE CULTURE     Status: None   Collection Time    03/12/14 10:19 AM      Result Value Ref Range Status   Specimen Description TISSUE   Final   Special Requests     Final   Value: PATIENT ON FOLLOWING VANCO AND ZOSYN RIGHT PLEURAL PEEL   Gram Stain     Final   Value: FEW WBC PRESENT, PREDOMINANTLY PMN     RARE SQUAMOUS  EPITHELIAL CELLS PRESENT     NO ORGANISMS SEEN     Performed at [facility]   Culture     Final   Value: NO GROWTH 3 DAYS     Performed at [facility]   Report Status 03/15/2014 FINAL   Final  ANAEROBIC CULTURE     Status: None   Collection Time    03/12/14 10:19 AM      Result Value Ref Range Status   Specimen Description TISSUE   Final   Special Requests     Final   Value: PATIENT ON FOLLOWING VANCO AND ZOSYN RIGHT PLEURAL PEEL   Gram Stain     Final   Value: FEW WBC PRESENT, PREDOMINANTLY PMN     RARE SQUAMOUS EPITHELIAL CELLS PRESENT     NO ORGANISMS SEEN     Performed at [facility]   Culture     Final   Value: NO ANAEROBES ISOLATED; CULTURE IN PROGRESS FOR 5 DAYS     Performed at [facility]   Report Status PENDING   Incomplete     Scheduled Meds: . amoxicillin-clavulanate  1 tablet Oral Q12H  . bisacodyl  10 mg Oral Daily  . enoxaparin (LOVENOX) injection  40 mg Subcutaneous Q24H  . insulin aspart  0-15 Units Subcutaneous TID WC  . insulin aspart  5 Units Subcutaneous TID WC  . insulin detemir  27 Units Subcutaneous BID  . Insulin Pen Starter Kit  1 kit Other Once  . pantoprazole  40 mg Oral Daily   Continuous Infusions: . sodium chloride 20 mL/hr at 03/15/14 1615  . 0.9 % NaCl with KCl 20 mEq / L 20 mL/hr at 03/16/14 0200     Regla Fitzgibbon, DO  [facility] Pager  [phone]  If 7PM-7AM, please contact night-coverage [email or web address] Password [email or web address] 03/16/2014, 2:20 PM   LOS: 6 days

## 2014-03-17 ENCOUNTER — Inpatient Hospital Stay (HOSPITAL_COMMUNITY): Payer: 59

## 2014-03-17 LAB — CBC WITH DIFFERENTIAL/PLATELET
BASOS ABS: 0.1 10*3/uL (ref 0.0–0.1)
Basophils Relative: 1 % (ref 0–1)
EOS ABS: 0.2 10*3/uL (ref 0.0–0.7)
Eosinophils Relative: 2 % (ref 0–5)
HEMATOCRIT: 33.6 % — AB (ref 39.0–52.0)
HEMOGLOBIN: 11 g/dL — AB (ref 13.0–17.0)
Lymphocytes Relative: 14 % (ref 12–46)
Lymphs Abs: 2 10*3/uL (ref 0.7–4.0)
MCH: 29.3 pg (ref 26.0–34.0)
MCHC: 32.7 g/dL (ref 30.0–36.0)
MCV: 89.6 fL (ref 78.0–100.0)
MONO ABS: 1.9 10*3/uL — AB (ref 0.1–1.0)
MONOS PCT: 13 % — AB (ref 3–12)
NEUTROS ABS: 10.2 10*3/uL — AB (ref 1.7–7.7)
Neutrophils Relative %: 70 % (ref 43–77)
Platelets: 433 10*3/uL — ABNORMAL HIGH (ref 150–400)
RBC: 3.75 MIL/uL — ABNORMAL LOW (ref 4.22–5.81)
RDW: 13.3 % (ref 11.5–15.5)
WBC: 14.4 10*3/uL — ABNORMAL HIGH (ref 4.0–10.5)

## 2014-03-17 LAB — ANAEROBIC CULTURE

## 2014-03-17 LAB — BASIC METABOLIC PANEL
BUN: 6 mg/dL (ref 6–23)
CO2: 32 mEq/L (ref 19–32)
CREATININE: 0.79 mg/dL (ref 0.50–1.35)
Calcium: 9.1 mg/dL (ref 8.4–10.5)
Chloride: 98 mEq/L (ref 96–112)
Glucose, Bld: 140 mg/dL — ABNORMAL HIGH (ref 70–99)
Potassium: 3.9 mEq/L (ref 3.7–5.3)
Sodium: 144 mEq/L (ref 137–147)

## 2014-03-17 LAB — GLUCOSE, CAPILLARY
Glucose-Capillary: 119 mg/dL — ABNORMAL HIGH (ref 70–99)
Glucose-Capillary: 219 mg/dL — ABNORMAL HIGH (ref 70–99)

## 2014-03-17 MED ORDER — INSULIN LISPRO 100 UNIT/ML (KWIKPEN): 5.0000 [IU] | PEN_INJECTOR | Freq: Three times a day (TID) | SUBCUTANEOUS | Status: DC

## 2014-03-17 MED ORDER — AMOXICILLIN-POT CLAVULANATE 875-125 MG PO TABS: 1.0000 | ORAL_TABLET | Freq: Two times a day (BID) | ORAL | Status: DC

## 2014-03-17 MED ORDER — INSULIN PEN NEEDLE 32G X 4 MM MISC: Status: DC

## 2014-03-17 NOTE — Progress Notes (Signed)
5 Days Post-Op Procedure(s) (LRB): VIDEO ASSISTED THORACOSCOPY (VATS)/EMPYEMA (Right) DECORTICATION (Right) Subjective: Feels well this AM Not much pain   Objective: Vital signs in last 24 hours: Temp:  [98 F (36.7 C)-98.6 F (37 C)] 98 F (36.7 C) (04/03 0534) Pulse Rate:  [95-97] 95 (04/03 0534) Cardiac Rhythm:  [-] Normal sinus rhythm;Sinus tachycardia (04/02 1930) Resp:  [18-20] 18 (04/03 0534) BP: (130-140)/(69-83) 136/83 mmHg (04/03 0534) SpO2:  [94 %-97 %] 94 % (04/03 0534)  Hemodynamic parameters for last 24 hours:    Intake/Output from previous day: 04/02 0701 - 04/03 0700 In: 960 [P.O.:960] Out: 600 [Urine:600] Intake/Output this shift:    General appearance: alert and no distress Neurologic: intact Heart: regular rate and rhythm Lungs: diminished breath sounds right base Wound: clean and dry  Lab Results:  Recent Labs  03/16/14 0522 03/17/14 0415  WBC 12.4* 14.4*  HGB 11.4* 11.0*  HCT 33.9* 33.6*  PLT 406* 433*   BMET:  Recent Labs  03/16/14 0522 03/17/14 0415  NA 143 144  K 3.4* 3.9  CL 100 98  CO2 31 32  GLUCOSE 124* 140*  BUN 6 6  CREATININE 0.68 0.79  CALCIUM 8.7 9.1    PT/INR: No results found for this basename: LABPROT, INR,  in the last 72 hours ABG    Component Value Date/Time   PHART 7.354 03/13/2014 0430   HCO3 20.4 03/13/2014 0430   TCO2 21.5 03/13/2014 0430   ACIDBASEDEF 4.2* 03/13/2014 0430   O2SAT 96.8 03/13/2014 0430   CBG (last 3)   Recent Labs  03/16/14 1554 03/16/14 2140 03/17/14 0612  GLUCAP 208* 156* 119*    Assessment/Plan: S/P Procedure(s) (LRB): VIDEO ASSISTED THORACOSCOPY (VATS)/EMPYEMA (Right) DECORTICATION (Right)  POD # 5 Decortication Doing well from a surgical standpoint OK for dc if Hospitalists are happy with his diabetic regimen Will need to complete course of Augmentin- until 04/03/2014  I will see him back in the office in 2 weeks    LOS: 7 days    Donni Oglesby  C 03/17/2014

## 2014-03-17 NOTE — Progress Notes (Signed)
Pt d/c home. Alert and oriented x4.  No c/o pain.  Dressings dry/intact.  Education given to pt on meds, activity, diet, and follow-up care and appointments, as well as incision care and dressing changes.  Pt also given education on diabetes management at home.  Pt verbalized understanding.  IV D/C.  Tele D/c

## 2014-03-17 NOTE — Discharge Summary (Signed)
Physician Discharge Summary  Dean Wallace WUJ:[id number] DOB: Jan 19, 1971 DOA: 03/10/2014  PCP: Crisoforo Oxford, PA-C  Admit date: 03/10/2014 Discharge date: 03/17/2014  Recommendations for Outpatient Follow-up:  1. Pt will need to follow up with PCP in 2 weeks post discharge 2. Please obtain BMP to evaluate electrolytes and kidney function 3. Please also check CBC to evaluate Hg and Hct levels   Discharge Diagnoses:  Active Problems:   Empyema of right pleural space   DM (diabetes mellitus)   Loculated pleural effusion   Sepsis   Type II or unspecified type diabetes mellitus without mention of complication, uncontrolled Empyema-Right  -03/09/2014 thoracocentesis--Streptococcus intermedius  -03/12/2014 decortication cultures--viridans strep  -Discontinue vancomycin  -plan Augmentin x 3 weeks post surgery--stop date 04/07/14--this will make a total of 28 days of therapy -03/12/2014--VATS/decortication  -path and cytology neg for malignancy  -Appreciate CTS followup  Diabetes mellitus type 2, uncontrolled  -Continue Levemir, increased to 27 units twice a day  -NovoLog 5 units with meals  -Continue sliding scale NovoLog  -Rx provided for lancets, needles and syringes and insulin pen needles Sepsis  -Present at the time of admission  -Secondary to empyema  -WBC improving  Hypertension  -Elevated blood pressure partly due to pain   Discharge Condition: Stable  Disposition: Home Follow-up Information   Follow up with Melrose Nakayama, MD On 04/11/2014. (PA/LAT CXR to be taken (at [place] which is in the same building as Dr. Leonarda Salon office) on 04/11/2014 at 1:00 pm ;Appointment with Dr. Pleas Koch is at 2:00 pm)    Specialty:  Cardiothoracic Surgery   Contact information:   [address] [place] [address] [phone]       Diet: Regular Wt Readings from Last 3 Encounters:  03/15/14 102.6 kg (226 lb 3.1 oz)  03/15/14 102.6  kg (226 lb 3.1 oz)  03/10/14 100.245 kg (221 lb)    History of present illness:  43 year old male with a history of diet-controlled diabetes mellitus who was seen at the primary care office on 3/25 for cough which has been going for 2 weeks, did not improve with Claritin. Patient on that the complained of severe back muscle spasm, mid to upper back when he tried to get out of bed. Patient was sent for imaging studies including d-dimer, chest x-ray, thoracic spine x-ray. Chest x-ray showed collapse/consolidation of the right lower lobe and right middle lobe most commonly associated with multifocal pneumonia, CT chest was recommended, patient was called for thoracentesis under ultrasound guidance, and underwent the procedure on 3/26, pleural fluid analysis showed exudative effusion. Patient underwent CT chest on 3/27 to rule out pulmonary embolism, which showed large loculated right pleural effusion worrisome for empyema with compressive atelectasis in the right lower lobe.  Patient admitted to telemetry floor on 3/27 and CTS consulted. The patient underwent VATS with decortication on 08/12/2014. The patient continued on IV antibiotics until cultures returned from his surgical cultures. His IV antibiotic spectrum was narrowed. The patient was switched to oral Augmentin. He tolerated it well. The patient remained hemodynamically stable and afebrile.     Consultants: Cardiothoracic surgery, Dr. Roxan Hockey  Discharge Exam: Filed Vitals:   03/17/14 0534  BP: 136/83  Pulse: 95  Temp: 98 F (36.7 C)  Resp: 18   Filed Vitals:   03/16/14 0800 03/16/14 1356 03/16/14 2200 03/17/14 0534  BP:  130/78 140/69 136/83  Pulse:  95 97 95  Temp:  98.6 F (37 C) 98 F (36.7 C) 98  F (36.7 C)  TempSrc:  Oral Oral Oral  Resp: 20 20 20 18   Height:      Weight:      SpO2: 96% 97% 94% 94%   General: A&O x 3, NAD, pleasant, cooperative Cardiovascular: RRR, no rub, no gallop, no S3 Respiratory: Right  basilar crackles. Left foot auscultation. No wheezing. Abdomen:soft, nontender, nondistended, positive bowel sounds Extremities: No edema, No lymphangitis, no petechiae  Discharge Instructions      Discharge Orders   Future Appointments Provider Department Dept Phone   04/11/2014 2:00 PM Melrose Nakayama, MD [facility] [phone]   Future Orders Complete By Expires   Ambulatory referral to Nutrition and Diabetic Education  As directed    Comments:     A1C 12/4%- New to insulin.  Please call in 4-6 weeks for appointment with patient.       Medication List    STOP taking these medications       ibuprofen 200 MG tablet  Commonly known as:  ADVIL,MOTRIN     levofloxacin 500 MG tablet  Commonly known as:  LEVAQUIN      TAKE these medications       amoxicillin-clavulanate 875-125 MG per tablet  Commonly known as:  AUGMENTIN  Take 1 tablet by mouth every 12 (twelve) hours.     guaiFENesin 600 MG 12 hr tablet  Commonly known as:  MUCINEX  Take 1,200 mg by mouth 2 (two) times daily as needed for cough.     insulin aspart 100 UNIT/ML FlexPen  Commonly known as:  NOVOLOG FLEXPEN  Inject 5 Units into the skin 3 (three) times daily with meals.     Insulin Detemir 100 UNIT/ML Pen  Commonly known as:  LEVEMIR  Inject 27 Units into the skin 2 (two) times daily.     Insulin Pen Needle 32G X 4 MM Misc  Use pen needles with levemir and novolog pen to dispense your insulin     oxyCODONE-acetaminophen 5-325 MG per tablet  Commonly known as:  PERCOCET/ROXICET  Take 1-2 tablets by mouth every 4 (four) hours as needed for severe pain.         The results of significant diagnostics from this hospitalization (including imaging, microbiology, ancillary and laboratory) are listed below for reference.    Significant Diagnostic Studies: Dg Chest 1 View  03/09/2014   CLINICAL DATA:  Status post right-sided thoracentesis  EXAM: CHEST - 1  VIEW  COMPARISON:  PA and lateral chest x-ray dated March 08, 2014  FINDINGS: There remains volume loss on the right little changed from yesterday's study. There is no evidence of a pneumothorax. The left lung is adequately inflated and clear. The cardiac silhouette where visualized appears mildly enlarged. The pulmonary vascularity on the left is normal. The is obscured on the right.  IMPRESSION: There is no evidence of a postprocedure pneumothorax following the right thoracentesis. The degree of volume loss on the right is little changed since yesterday's study.   Electronically Signed   By: Mykah Bellomo  [place]   On: 03/09/2014 14:24   Dg Chest 2 View  03/17/2014   CLINICAL DATA:  Evaluate for pneumothorax status post right VATS and decortication.  EXAM: CHEST  2 VIEW  COMPARISON:  03/16/2014  FINDINGS: Right jugular central venous catheter remains in place with tip overlying the lower SVC. Right chest tube has been removed. Cardiomediastinal silhouette is grossly unchanged. No pneumothorax is identified. Opacity in the right lung base likely  represents a combination of pleural effusion, right hemidiaphragm elevation, and right basilar parenchymal lung opacity, overall not significantly changed from the prior study. Linear opacity in the right mid lung is unchanged. No pneumothorax is identified. The left lung remains clear. No acute osseous abnormality is seen.  IMPRESSION: 1. Interval removal of right chest tube.  No pneumothorax seen. 2. Unchanged appearance of right pleural effusion and basilar parenchymal opacities, which may reflect infection or atelectasis.   Electronically Signed   By: Logan Bores   On: 03/17/2014 08:07   Chest 2 View  03/11/2014   CLINICAL DATA:  Preop.  EXAM: CHEST  2 VIEW  FINDINGS: Severe right lung airspace disease and right effusion again noted, not significantly changed. Heart is mildly enlarged. Left lung is clear. Low lung volumes. No acute bony abnormality.  IMPRESSION: No  significant change in the right lung opacity in right effusion.   Electronically Signed   By: Rolm Baptise M.D.   On: 03/11/2014 15:16   Dg Chest 2 View  03/08/2014   CLINICAL DATA:  Cough and back pain.  EXAM: CHEST  2 VIEW  COMPARISON:  None.  FINDINGS: There is complete consolidation of the right lower lobe. There may be a subpulmonic pleural effusion is well. Consolidation appears to extend into the right middle lobe. Left lung demonstrates mild atelectasis. Lung volumes are reduced. Mediastinal contours appear within normal limits. Cardiopericardial silhouette is partially obscured.  Followup to ensure radiographic clearing and exclude an underlying lesion is recommended. Typically clearing will be observed at 8 weeks.  IMPRESSION: Collapse/consolidation of the right lower lobe and right middle lobe. This is most commonly associated with multifocal pneumonia. If the patient does not have infectious symptoms, a chest CT with contrast is recommended for further assessment to evaluate the right lung consolidation. If the patient does have infectious symptoms, empiric antibiotic therapy and repeat imaging to ensure resolution is recommended.   Electronically Signed   By: Dereck Ligas M.D.   On: 03/08/2014 11:48   Dg Thoracic Spine W/swimmers  03/08/2014   CLINICAL DATA:  Mid back pain for 2 days. Cough for 1 week. No fever. Short of breath.  EXAM: THORACIC SPINE - 2 VIEW + SWIMMERS  COMPARISON:  None.  FINDINGS: Mild thoracic spondylosis is present. The bones are partially obscured by a consolidation in the lungs. Please see chest x-ray report. No compression fractures are identified. Moderate lower cervical degenerative disease is present. 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  IMPRESSION: Mild thoracic spondylosis without an acute osseous abnormality.   Electronically Signed   By: Dereck Ligas M.D.   On: 03/08/2014 11:49   Ct Angio Chest Pe W/cm &/or Wo Cm  03/10/2014   CLINICAL DATA:  Short of  breath and cough.  Recent pneumonia  EXAM: CT ANGIOGRAPHY CHEST WITH CONTRAST  TECHNIQUE: Multidetector CT imaging of the chest was performed using the standard protocol during bolus administration of intravenous contrast. Multiplanar CT image reconstructions and MIPs were obtained to evaluate the vascular anatomy.  CONTRAST:  37m OMNIPAQUE IOHEXOL 350 MG/ML SOLN  COMPARISON:  CXR 03/09/2014  FINDINGS: Negative for pulmonary embolism.  Negative for aortic dissection or aneurysm. Heart size is normal. Mild coronary artery calcification noted. Negative for pericardial effusion.  Large loculated right pleural effusion, suspicious for empyema. Compressive atelectasis in the right lower lobe.  Negative for infiltrate or effusion in the left chest.  Fatty infiltration of the liver with no focal liver lesion identified. Spleen is  normal in size.  Review of the MIP images confirms the above findings.  IMPRESSION: Negative for pulmonary embolism.  Large loculated right pleural effusion, worrisome for empyema. There is compressive atelectasis in the right lower lobe.  Fatty liver   Electronically Signed   By: Franchot Gallo M.D.   On: 03/10/2014 12:38   Dg Chest Port 1 View  03/16/2014   CLINICAL DATA:  Post decortication, evaluate right-sided chest tube  EXAM: PORTABLE CHEST - 1 VIEW  COMPARISON:  DG CHEST 1V PORT dated 03/15/2014; DG CHEST 1 VIEW dated 03/09/2014; CT ANGIO CHEST W/CM &/OR WO/CM dated 03/10/2014; DG CHEST 2 VIEW dated 03/11/2014  FINDINGS: Grossly unchanged enlarged cardiac silhouette and mediastinal contours. Stable position of support apparatus. No pneumothorax, though examination minimally degraded secondary to patient's overlying chin. Small to moderate size right-sided effusion with associated right basilar heterogeneous as consolidative opacities are unchanged. There is mild pulmonary venous congestion within the aerated portion of the right lung. No new focal airspace opacities. No new focal airspace  opacities unchanged bones.  IMPRESSION: 1.  Stable positioning of support apparatus.  No pneumothorax. 2. Unchanged small to moderate sized right-sided effusion and associated right basilar heterogeneous/consolidative opacities worrisome for residual infection.   Electronically Signed   By: Sandi Mariscal M.D.   On: 03/16/2014 07:49   Dg Chest Port 1 View  03/15/2014   CLINICAL DATA:  Postoperative decortication procedure  EXAM: PORTABLE CHEST - 1 VIEW  COMPARISON:  March 14, 2014  FINDINGS: The central catheter tip is at the cavoatrial junction. There is a chest tube on the right. There is no appreciable pneumothorax.  There is persistent elevation the right hemidiaphragm with atelectatic change in the right lower lobe. Left lung is clear. Heart is prominent but stable. Pulmonary vascularity is normal. No adenopathy.  IMPRESSION: Persistent elevation of the right hemidiaphragm with right lower lobe atelectatic change. No new opacity. Left lung clear. Tube and catheter positions unchanged without appreciable pneumothorax.   Electronically Signed   By: Lowella Grip M.D.   On: 03/15/2014 07:32   Dg Chest Port 1 View  03/14/2014   CLINICAL DATA:  decortication  EXAM: PORTABLE CHEST - 1 VIEW  COMPARISON:  03/13/2014  FINDINGS: One of 2 right chest tubes has been removed. Right pleural thickening/ effusion and right lower lobe atelectasis unchanged. No pneumothorax.  Right jugular catheter tip in the SVC. Cardiac enlargement without heart failure. Left lung remains clear.  IMPRESSION: Negative for pneumothorax on the right. Right pleural thickening and right lower lobe atelectasis unchanged.   Electronically Signed   By: Franchot Gallo M.D.   On: 03/14/2014 07:44   Dg Chest Port 1 View  03/13/2014   CLINICAL DATA:  Status post VATS for right empyema.  EXAM: PORTABLE CHEST - 1 VIEW  COMPARISON:  DG CHEST 1V PORT dated 03/12/2014; DG CHEST 2 VIEW dated 03/11/2014; CT ANGIO CHEST W/CM &/OR WO/CM dated 03/10/2014   FINDINGS: Stable positioning of 2 separate right chest tubes. No pneumothorax or significant reaccumulation of right pleural fluid is identified. There is stable significant volume loss of the right lung with marginally improved aeration. Aeration of the left lung has improved since prior study. Stable cardiomegaly.  IMPRESSION: Improved aeration. No right-sided pneumothorax or pleural fluid reaccumulation.   Electronically Signed   By: Aletta Edouard M.D.   On: 03/13/2014 07:18   Dg Chest Portable 1 View  03/12/2014   CLINICAL DATA:  Post VATS for empyema.  EXAM:  PORTABLE CHEST - 1 VIEW  COMPARISON:  03/11/2014  FINDINGS: There appears to be three right-sided chest tubes. There are patchy densities throughout the right lung. Overall, there are low lung volumes. Right jugular central line has been placed. Catheter tip in lower SVC. No evidence for a pneumothorax. Heart size is grossly stable.  IMPRESSION: Central venous catheter tip in the lower SVC.  Right chest tubes without a large pneumothorax.  Patchy densities throughout the right lung.   Electronically Signed   By: Markus Daft M.D.   On: 03/12/2014 13:22   US Thoracentesis Asp Pleural Space W/img Guide  03/09/2014   CLINICAL DATA:  Pneumonia, cough, right pleural effusion. Request is made for diagnostic/therapeutic right thoracentesis  EXAM: ULTRASOUND GUIDED DIAGNOSTIC RIGHT THORACENTESIS  COMPARISON:  None.  PROCEDURE: An ultrasound guided thoracentesis was thoroughly discussed with the patient and questions answered. The benefits, risks, alternatives and complications were also discussed. The patient understands and wishes to proceed with the procedure. Written consent was obtained.  Ultrasound was performed to localize and mark an adequate pocket of fluid in the right chest. The area was then prepped and draped in the normal sterile fashion. 1% Lidocaine was used for local anesthesia. Under ultrasound guidance a 19 gauge Yueh catheter was  introduced. Thoracentesis was performed. The catheter was removed and a dressing applied.  Complications:  None.  FINDINGS: A total of approximately 140 cc's of turbid, yellow fluid was removed. The fluid sample wassent for laboratory analysis. The pleural fluid collection was multiloculated in nature. Only the above amount of fluid could be removed at this time.  IMPRESSION: Successful ultrasound guided diagnostic right thoracentesis yielding 140 cc's of pleural fluid. Pleural fluid collection appears multiloculated on ultrasound imaging. Recommend follow-up CT chest for further evaluation.  Read by: Rowe Robert ,P.A.-C.   Electronically Signed   By: Daryll Brod M.D.   On: 03/09/2014 14:43     Microbiology: Recent Results (from the past 240 hour(s))  BODY FLUID CULTURE     Status: None   Collection Time    03/09/14  2:24 PM      Result Value Ref Range Status   Specimen Description PLEURAL   Final   Special Requests NONE   Final   Gram Stain     Final   Value: FEW WBC PRESENT,BOTH PMN AND MONONUCLEAR     NO ORGANISMS SEEN     Performed at [facility]   Culture     Final   Value: MODERATE STREPTOCOCCUS INTERMEDIUS     Performed at [facility]   Report Status 03/12/2014 FINAL   Final   Organism ID, Bacteria STREPTOCOCCUS INTERMEDIUS   Final  CULTURE, BLOOD (ROUTINE X 2)     Status: None   Collection Time    03/10/14  8:40 PM      Result Value Ref Range Status   Specimen Description BLOOD RIGHT ARM   Final   Special Requests BOTTLES DRAWN AEROBIC ONLY 5CC   Final   Culture  Setup Time     Final   Value: 03/10/2014 23:55     Performed at [facility]   Culture     Final   Value: NO GROWTH 5 DAYS     Performed at [facility]   Report Status 03/16/2014 FINAL   Final  CULTURE, BLOOD (ROUTINE X 2)     Status: None   Collection Time    03/10/14  8:50 PM  Result Value Ref Range Status   Specimen Description BLOOD LEFT HAND   Final   Special  Requests BOTTLES DRAWN AEROBIC ONLY [facility]   Final   Culture  Setup Time     Final   Value: 03/10/2014 23:56     Performed at [facility]   Culture     Final   Value: NO GROWTH 5 DAYS     Performed at [facility]   Report Status 03/16/2014 FINAL   Final  SURGICAL PCR SCREEN     Status: None   Collection Time    03/12/14  4:01 AM      Result Value Ref Range Status   MRSA, PCR NEGATIVE  NEGATIVE Final   Staphylococcus aureus NEGATIVE  NEGATIVE Final   Comment:            The Xpert SA Assay (FDA     approved for NASAL specimens     in patients over 75 years of age),     is one component of     a comprehensive surveillance     program.  Test performance has     been validated by [employer] for patients greater     than or equal to 99 year old.     It is not intended     to diagnose infection nor to     guide or monitor treatment.  BODY FLUID CULTURE     Status: None   Collection Time    03/12/14 10:14 AM      Result Value Ref Range Status   Specimen Description FLUID RIGHT PLEURAL   Final   Special Requests PATIENT ON FOLLOWING VANCO ADN ZOSYN NO1   Final   Gram Stain     Final   Value: WBC PRESENT, PREDOMINANTLY PMN     ABUNDANT GRAM POSITIVE COCCI IN PAIRS     IN CHAINS ABUNDANT GRAM NEGATIVE COCCOBACILLI     Performed at [facility]   Culture     Final   Value: MODERATE VIRIDANS STREPTOCOCCUS     Performed at [facility]   Report Status 03/16/2014 FINAL   Final   Organism ID, Bacteria VIRIDANS STREPTOCOCCUS   Final  TISSUE CULTURE     Status: None   Collection Time    03/12/14 10:19 AM      Result Value Ref Range Status   Specimen Description TISSUE   Final   Special Requests     Final   Value: PATIENT ON FOLLOWING VANCO AND ZOSYN RIGHT PLEURAL PEEL   Gram Stain     Final   Value: FEW WBC PRESENT, PREDOMINANTLY PMN     RARE SQUAMOUS EPITHELIAL CELLS PRESENT     NO ORGANISMS SEEN     Performed at [facility]   Culture      Final   Value: NO GROWTH 3 DAYS     Performed at [facility]   Report Status 03/15/2014 FINAL   Final  ANAEROBIC CULTURE     Status: None   Collection Time    03/12/14 10:19 AM      Result Value Ref Range Status   Specimen Description TISSUE   Final   Special Requests     Final   Value: PATIENT ON FOLLOWING VANCO AND ZOSYN RIGHT PLEURAL PEEL   Gram Stain     Final   Value: FEW WBC PRESENT, PREDOMINANTLY PMN  RARE SQUAMOUS EPITHELIAL CELLS PRESENT     NO ORGANISMS SEEN     Performed at [facility]   Culture     Final   Value: NO ANAEROBES ISOLATED     Performed at [facility]   Report Status 03/17/2014 FINAL   Final     Labs: Basic Metabolic Panel:  Recent Labs Lab 03/13/14 0400 03/14/14 0420 03/15/14 0425 03/16/14 0522 03/17/14 0415  NA 139 138 140 143 144  K 3.6* 3.1* 3.1* 3.4* 3.9  CL 107 103 103 100 98  CO2 20 22 25 31  32  GLUCOSE 165* 159* 174* 124* 140*  BUN 13 11 7 6 6   CREATININE 0.65 0.65 0.65 0.68 0.79  CALCIUM 8.0* 8.1* 8.3* 8.7 9.1   Liver Function Tests:  Recent Labs Lab 03/10/14 2050 03/11/14 0440 03/11/14 1600 03/14/14 0420  AST 13 12 13 25   ALT 22 22 25  50  ALKPHOS 81 88 96 68  BILITOT 0.5 0.5 0.5 0.3  PROT 6.9 7.1 7.7 4.9*  ALBUMIN 2.5* 2.6* 2.6* 1.6*   No results found for this basename: LIPASE, AMYLASE,  in the last 168 hours No results found for this basename: AMMONIA,  in the last 168 hours CBC:  Recent Labs Lab 03/10/14 2050  03/12/14 0345 03/13/14 0400 03/14/14 0420 03/15/14 0425 03/16/14 0522 03/17/14 0415  WBC 20.0*  < > 21.0* 19.7* 12.8* 12.1* 12.4* 14.4*  NEUTROABS 16.7*  --  17.9*  --   --   --   --  10.2*  HGB 14.4  < > 15.2 11.5* 11.0* 10.8* 11.4* 11.0*  HCT 40.6  < > 43.0 33.3* 31.9* 31.6* 33.9* 33.6*  MCV 86.0  < > 87.2 86.9 86.2 87.1 88.5 89.6  PLT 336  < > 354 292 305 339 406* 433*  < > = values in this interval not displayed. Cardiac Enzymes: No results found for this  basename: CKTOTAL, CKMB, CKMBINDEX, TROPONINI,  in the last 168 hours BNP: No components found with this basename: POCBNP,  CBG:  Recent Labs Lab 03/16/14 1132 03/16/14 1554 03/16/14 2140 03/17/14 0612 03/17/14 1115  GLUCAP 139* 208* 156* 119* 219*    Time coordinating discharge:  Greater than 30 minutes  Signed:  Krishawna Stiefel, DO [facility] Pager: [phone] 03/17/2014, 12:53 PM

## 2014-03-29 ENCOUNTER — Ambulatory Visit (INDEPENDENT_AMBULATORY_CARE_PROVIDER_SITE_OTHER): Payer: 59 | Admitting: Medical

## 2014-03-29 ENCOUNTER — Encounter: Payer: Self-pay | Admitting: Medical

## 2014-03-29 VITALS — BP 120/80 | HR 100 | Temp 97.8°F | Resp 14 | Wt 211.0 lb

## 2014-03-29 DIAGNOSIS — IMO0001 Reserved for inherently not codable concepts without codable children: Secondary | ICD-10-CM

## 2014-03-29 DIAGNOSIS — E1165 Type 2 diabetes mellitus with hyperglycemia: Principal | ICD-10-CM

## 2014-03-29 DIAGNOSIS — J869 Pyothorax without fistula: Secondary | ICD-10-CM

## 2014-03-29 LAB — CBC WITH DIFFERENTIAL/PLATELET
BASOS ABS: 0.1 10*3/uL (ref 0.0–0.1)
Basophils Relative: 1 % (ref 0–1)
EOS PCT: 1 % (ref 0–5)
Eosinophils Absolute: 0.1 10*3/uL (ref 0.0–0.7)
HCT: 37 % — ABNORMAL LOW (ref 39.0–52.0)
Hemoglobin: 12.5 g/dL — ABNORMAL LOW (ref 13.0–17.0)
LYMPHS ABS: 2 10*3/uL (ref 0.7–4.0)
Lymphocytes Relative: 21 % (ref 12–46)
MCH: 28.3 pg (ref 26.0–34.0)
MCHC: 33.8 g/dL (ref 30.0–36.0)
MCV: 83.7 fL (ref 78.0–100.0)
Monocytes Absolute: 0.8 10*3/uL (ref 0.1–1.0)
Monocytes Relative: 8 % (ref 3–12)
Neutro Abs: 6.6 10*3/uL (ref 1.7–7.7)
Neutrophils Relative %: 69 % (ref 43–77)
Platelets: 485 10*3/uL — ABNORMAL HIGH (ref 150–400)
RBC: 4.42 MIL/uL (ref 4.22–5.81)
RDW: 13.3 % (ref 11.5–15.5)
WBC: 9.5 10*3/uL (ref 4.0–10.5)

## 2014-03-29 LAB — BASIC METABOLIC PANEL
BUN: 12 mg/dL (ref 6–23)
CO2: 28 mEq/L (ref 19–32)
Calcium: 9.3 mg/dL (ref 8.4–10.5)
Chloride: 102 mEq/L (ref 96–112)
Creat: 0.66 mg/dL (ref 0.50–1.35)
GLUCOSE: 144 mg/dL — AB (ref 70–99)
Potassium: 4.4 mEq/L (ref 3.5–5.3)
Sodium: 139 mEq/L (ref 135–145)

## 2014-03-29 MED ORDER — ALPRAZOLAM 0.5 MG PO TABS: 0.5000 mg | ORAL_TABLET | Freq: Every evening | ORAL | Status: DC | PRN

## 2014-03-29 NOTE — Addendum Note (Signed)
Addended by: Carlena Hurl on: 03/29/2014 04:10 PM   Modules accepted: Level of Service
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
## 2014-03-29 NOTE — Patient Instructions (Signed)
Diabetes:  Increase morning Levemir to 30 units.  Continue 27 units Levemir at bedtime.  Do this for the next week, continuing Humalog meal time insulin at 5 units per meal.  After this week, then continue Humalog 5 units before breakfast.  If you are continuing to see 150+ glucose readings before lunch or dinner after another week, then increase to 7 units of Humalog before lunch and dinner.  We don't want to see glucose readings under 70.    Continue to be careful with diet, eat a diabetic diet  Follow up with surgeon as planned  Plan to recheck here in 3-4 weeks.

## 2014-03-30 ENCOUNTER — Telehealth: Payer: Self-pay | Admitting: Internal Medicine

## 2014-03-30 NOTE — Telephone Encounter (Signed)
Message copied by Florestine Avers on Thu Mar 30, 2014  9:53 AM ------      Message from: Glade Lloyd, DAVID S      Created: Wed Mar 29, 2014  4:24 PM       pls complete the remaining portions of his FMLA.  Patient name, contact info, other areas left blank that you can complete, stamp and fax back ------

## 2014-03-30 NOTE — Telephone Encounter (Signed)
Faxed back.

## 2014-04-05 ENCOUNTER — Telehealth: Payer: Self-pay | Admitting: Family Medicine

## 2014-04-05 ENCOUNTER — Other Ambulatory Visit: Payer: Self-pay | Admitting: Family Medicine

## 2014-04-05 MED ORDER — GLUCOSE BLOOD VI STRP: ORAL_STRIP | Status: DC

## 2014-04-05 MED ORDER — INSULIN DETEMIR 100 UNIT/ML FLEXPEN: 27.0000 [IU] | PEN_INJECTOR | Freq: Two times a day (BID) | SUBCUTANEOUS | Status: DC

## 2014-04-05 MED ORDER — INSULIN PEN NEEDLE 32G X 4 MM MISC: Status: DC

## 2014-04-05 NOTE — Telephone Encounter (Signed)
PT called and needs refill on Accucheck Nano test strips and needles for flex pen and Levamir to the [facility] on [address] , [place], [place]

## 2014-04-05 NOTE — Telephone Encounter (Signed)
REFILLS WAS SENT TO HIS PHARMACY. CLS

## 2014-04-10 ENCOUNTER — Other Ambulatory Visit: Payer: Self-pay | Admitting: Thoracic Surgery (Cardiothoracic Vascular Surgery)

## 2014-04-10 DIAGNOSIS — J90 Pleural effusion, not elsewhere classified: Secondary | ICD-10-CM

## 2014-04-11 ENCOUNTER — Ambulatory Visit (INDEPENDENT_AMBULATORY_CARE_PROVIDER_SITE_OTHER): Payer: Self-pay | Admitting: Thoracic Surgery (Cardiothoracic Vascular Surgery)

## 2014-04-11 ENCOUNTER — Ambulatory Visit
Admission: RE | Admit: 2014-04-11 | Discharge: 2014-04-11 | Disposition: A | Discharge status: Home / Self Care | Payer: 59 | Source: Ambulatory Visit | Attending: Thoracic Surgery (Cardiothoracic Vascular Surgery) | Admitting: Thoracic Surgery (Cardiothoracic Vascular Surgery)

## 2014-04-11 ENCOUNTER — Encounter: Payer: Self-pay | Admitting: Thoracic Surgery (Cardiothoracic Vascular Surgery)

## 2014-04-11 VITALS — BP 125/90 | HR 75 | Resp 16 | Ht 65.0 in | Wt 208.5 lb

## 2014-04-11 DIAGNOSIS — J869 Pyothorax without fistula: Secondary | ICD-10-CM

## 2014-04-11 DIAGNOSIS — Z09 Encounter for follow-up examination after completed treatment for conditions other than malignant neoplasm: Secondary | ICD-10-CM

## 2014-04-11 DIAGNOSIS — J90 Pleural effusion, not elsewhere classified: Secondary | ICD-10-CM

## 2014-04-11 MED ORDER — OXYCODONE-ACETAMINOPHEN 5-325 MG PO TABS: 1.0000 | ORAL_TABLET | Freq: Four times a day (QID) | ORAL | Status: DC | PRN
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
## 2014-04-25 ENCOUNTER — Ambulatory Visit (INDEPENDENT_AMBULATORY_CARE_PROVIDER_SITE_OTHER): Payer: 59 | Admitting: Medical

## 2014-04-25 ENCOUNTER — Encounter: Payer: Self-pay | Admitting: Medical

## 2014-04-25 VITALS — BP 120/82 | HR 80 | Temp 98.1°F | Resp 14 | Wt 201.0 lb

## 2014-04-25 DIAGNOSIS — E1165 Type 2 diabetes mellitus with hyperglycemia: Principal | ICD-10-CM

## 2014-04-25 DIAGNOSIS — IMO0001 Reserved for inherently not codable concepts without codable children: Secondary | ICD-10-CM

## 2014-04-25 MED ORDER — SAXAGLIPTIN-METFORMIN ER 5-1000 MG PO TB24: 1.0000 | ORAL_TABLET | Freq: Every day | ORAL | Status: DC

## 2014-04-25 NOTE — Progress Notes (Signed)
   Subjective:   Dean Wallace is a 43 y.o. male presenting on 04/25/2014 with Follow-up  I saw him a month ago after hospital f/u for empyema and uncontrolled diabetes.  Since last visit using Levemir 30 units am, 27 units QHS.  Humalog 5units each meal.  Since last visit glucose readings on his meter shows 7 day average 92, 14 day average 94, 30 day average 104 glucose.  Highest readings 117.  Checking glucose before each meal.  No readings over 130 before meals.  No symptoms of concern.  Breathing ok.  No polydipsia, no polyuria.  No other aggravating or relieving factors.  No other complaint.    Review of Systems ROS as in subjective      Objective:    BP 120/82  Pulse 80  Temp(Src) 98.1 F (36.7 C) (Oral)  Resp 14  Wt 201 lb (91.173 kg)  Wt Readings from Last 3 Encounters:  04/25/14 201 lb (91.173 kg)  04/11/14 208 lb 8 oz (94.575 kg)  03/29/14 211 lb (95.709 kg)    General appearance: alert, no distress, WD/WN     Assessment: Encounter Diagnosis  Name Primary?  . Type II or unspecified type diabetes mellitus without mention of complication, uncontrolled Yes     Plan: His glucose readings are much improved on his current regimen, however I like to get him off some of the insulin and more towards oral medication. He is doing good with diet and exercise, very disciplined with glucose readings.  We will stop Humalog, begin Kombiglyze XR daily.  For now he will continue Levemir twice daily 30 units a.m., 27 units p.m.  followup 1-2 months  Mancil was seen today for follow-up.  Diagnoses and associated orders for this visit:  Type II or unspecified type diabetes mellitus without mention of complication, uncontrolled     Return 1-2 mo.

## 2014-04-25 NOTE — Patient Instructions (Signed)
  Thank you for giving me the opportunity to serve you today.    Your diagnosis today includes: Encounter Diagnosis  Name Primary?  . Type II or unspecified type diabetes mellitus without mention of complication, uncontrolled Yes     Specific recommendations today include:  Levemir 30 units in the morning, 27 units at nighttime  Begin Kombiglyze XR 2.5/1000mg, 1 tablet daily until you run out of samples and then increase to the 5 mg/1000 mg tablet daily  Go ahead and stop meal time Humalog insulin for now  Continue to monitor your sugars.  The goal is to keep your sugars where they are at this time  Continue healthy diet, routine exercise  Return 1-2 mo.

## 2014-04-25 NOTE — Progress Notes (Deleted)
  Subjective:   Dean Wallace is an 43 y.o. male who presents for follow up of Type 2 diabetes mellitus.   Patient is checking home blood sugars.   Home blood sugar records: low 70'"s to 114 Current symptoms include: none. Patient denies no problems.  Patient is checking their feet daily. Foot concerns (callous, ulcer, wound, thickened nails, toenail fungus, skin fungus, hammer toe): none Last dilated eye exam  Current treatments: none. Medication compliance: good  Current diet: well balanced Current exercise: walking Known diabetic complications: none   The following portions of the patient's history were reviewed and updated as appropriate: allergies, current medications, past family history, past medical history, past social history, past surgical history and problem list.  ROS as in subjective above    Objective:      Assessment:   No diagnosis found.   Plan:   Diabetes Mellitus type 2: Education: Reviewed 'ABCs' of diabetes management (respective goals in parentheses):  A1C (<7), blood pressure (<130/80), and cholesterol (LDL <100)   {dm dx list:14078}   Compliance at present is estimated to be {good/fair/poor:33178}. Efforts to improve compliance (if necessary) will be directed at {compliance:16716}.    Blood pressure: {control:10681} .   An ACE/ARB *** currently part of their treatment regimen.   Dyslipidemia under {good/fair/poor:33178} control. .  A statin *** currently part of their treatment regimen.   {dm tx plan:14079}    Follow up: {NUMBERS; 0-10:33138} {time:11}

## 2014-04-26 ENCOUNTER — Encounter: Payer: Self-pay | Admitting: Medical

## 2014-04-26 ENCOUNTER — Other Ambulatory Visit: Payer: Self-pay | Admitting: Medical

## 2014-04-26 ENCOUNTER — Telehealth: Payer: Self-pay | Admitting: Medical

## 2014-04-26 MED ORDER — SAXAGLIPTIN-METFORMIN ER 5-1000 MG PO TB24: 1.0000 | ORAL_TABLET | Freq: Every day | ORAL | Status: DC

## 2014-04-26 MED ORDER — INSULIN DETEMIR 100 UNIT/ML FLEXPEN: PEN_INJECTOR | SUBCUTANEOUS | Status: DC

## 2014-04-26 MED ORDER — INSULIN PEN NEEDLE 32G X 4 MM MISC: Status: DC

## 2014-04-26 NOTE — Telephone Encounter (Signed)
pls call out the pen needles.  I sent the other 2.

## 2014-04-26 NOTE — Telephone Encounter (Signed)
done

## 2014-04-26 NOTE — Telephone Encounter (Signed)
Fax refill requests  Kombiglyze XR tabs 04/999  90 day supply  4 refills BD pen Ned uf nano 36m 100"s    32G5/32    90 day supply  4 refills levemir flextouch pen 3ML 5's   100U  90 day supply  4 refills

## 2014-05-09 ENCOUNTER — Encounter: Payer: 59 | Attending: Medical | Admitting: *Deleted

## 2014-05-09 DIAGNOSIS — Z713 Dietary counseling and surveillance: Secondary | ICD-10-CM | POA: Insufficient documentation

## 2014-05-09 DIAGNOSIS — E119 Type 2 diabetes mellitus without complications: Secondary | ICD-10-CM | POA: Insufficient documentation

## 2014-05-09 DIAGNOSIS — E1165 Type 2 diabetes mellitus with hyperglycemia: Secondary | ICD-10-CM

## 2014-05-09 DIAGNOSIS — IMO0001 Reserved for inherently not codable concepts without codable children: Secondary | ICD-10-CM
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
## 2014-05-09 NOTE — Patient Instructions (Signed)
Plan:  Aim for 3 Carb Choices per meal (45 grams) +/- 1 either way  Aim for 0-15 Carbs per snack if hungry  Include protein in moderation with your meals and snacks Consider reading food labels for Total Carbohydrate and Fat Grams of foods Consider  increasing your activity level by walking  for 30 minutes daily as tolerated Consider checking BG at alternate times per day as directed by MD  Continue taking medication  as directed by MD  Carry glucometer with you at all times. Carry snack with you at all times Consider [place] Try to reduce to 2% milk Consider [place] for butter substitute Look at [employer] yogurt Reduced calorie bread / Lynnae Sandhoff & [employer] is also reduced carb.

## 2014-06-12 ENCOUNTER — Encounter: Payer: Self-pay | Admitting: Medical

## 2014-06-12 ENCOUNTER — Ambulatory Visit (INDEPENDENT_AMBULATORY_CARE_PROVIDER_SITE_OTHER): Payer: 59 | Admitting: Medical

## 2014-06-12 ENCOUNTER — Other Ambulatory Visit: Payer: Self-pay | Admitting: Family Medicine

## 2014-06-12 VITALS — BP 120/82 | HR 77 | Temp 98.2°F | Resp 14 | Wt 185.0 lb

## 2014-06-12 DIAGNOSIS — IMO0001 Reserved for inherently not codable concepts without codable children: Secondary | ICD-10-CM

## 2014-06-12 DIAGNOSIS — E1165 Type 2 diabetes mellitus with hyperglycemia: Principal | ICD-10-CM

## 2014-06-12 LAB — POCT GLYCOSYLATED HEMOGLOBIN (HGB A1C): HEMOGLOBIN A1C: 5.4

## 2014-06-12 MED ORDER — GLUCOSE BLOOD VI STRP: ORAL_STRIP | Status: DC
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
## 2014-06-12 NOTE — Patient Instructions (Signed)
  Thank you for giving me the opportunity to serve you today.    Your diagnosis today includes: Encounter Diagnosis  Name Primary?  . Type II or unspecified type diabetes mellitus without mention of complication, uncontrolled Yes     Specific recommendations today include:  Continue Kombiglyze as usual  Change Levemir to 27 units at bedtime  STOP the morning Levemir.  Goal is to keep morning glucose in the 80-110 range.     Over the next few weeks, if your morning glucose is staying well under 110, then you may cut down 3 units of Levemir every 3 days until you are staying 80-110 morning glucose  So, for example, in 2 weeks, if you are taking 27 units of levemir at bedtime, but morning glucoses is average 90, then cut down to 24 units of Levemir for a period of time until you know where the morning glucose is running.    If any questions about the Levemir, call us  We will call back with lab results  continue healthy diet and exercise regimen  Return pending labs.

## 2014-06-13 LAB — LIPID PANEL
CHOLESTEROL: 182 mg/dL (ref 0–200)
HDL: 38 mg/dL — ABNORMAL LOW (ref >39)
LDL Cholesterol: 127 mg/dL — ABNORMAL HIGH (ref 0–99)
TRIGLYCERIDES: 83 mg/dL (ref <150)
Total CHOL/HDL Ratio: 4.8 Ratio
VLDL: 17 mg/dL (ref 0–40)

## 2014-06-13 LAB — MICROALBUMIN / CREATININE URINE RATIO
Creatinine, Urine: 160.8 mg/dL
MICROALB/CREAT RATIO: 3.1 mg/g (ref 0.0–30.0)
Microalb, Ur: 0.5 mg/dL (ref 0.00–1.89)

## 2014-06-21 ENCOUNTER — Other Ambulatory Visit: Payer: Self-pay | Admitting: Family Medicine

## 2014-06-21 ENCOUNTER — Telehealth: Payer: Self-pay | Admitting: Medical

## 2014-06-21 NOTE — Telephone Encounter (Signed)
I change the patients meter in the system. CLS

## 2014-06-21 NOTE — Telephone Encounter (Signed)
I place the orders in the system for the new one touch ultra meter. CLS

## 2014-06-21 NOTE — Telephone Encounter (Signed)
[facility] called and states Accu-check not covered by pt's ins unless pt is on insulin pump.  So changed to covered meter, One Touch Ultra, meter, lancets, strips all renewed and will be $0 co pay.  Pt informed.

## 2014-07-07 ENCOUNTER — Telehealth: Payer: Self-pay | Admitting: Internal Medicine

## 2014-07-07 NOTE — Telephone Encounter (Signed)
Fax came in from [facility] saying that pt is currently using ACCU-CHEK SmartView and is not covered on pt insurance. Covered alternative is Lifescan (onetouch). Send in [facility]

## 2014-07-07 NOTE — Telephone Encounter (Signed)
Please call it in

## 2014-07-07 NOTE — Telephone Encounter (Signed)
I fax over a Rx to [facility] in reference to his meter. CLS

## 2014-07-26 ENCOUNTER — Telehealth: Payer: Self-pay | Admitting: Family Medicine

## 2014-07-26 NOTE — Telephone Encounter (Signed)
Pt called and states he is completely off of injectible insulin for 2 days.  Morning readings mid 80 to low 90's.  Rest of testing upper 90's  No dropping out any more  Pt ph [phone]

## 2014-07-27 NOTE — Telephone Encounter (Signed)
Reported to patient.

## 2014-07-27 NOTE — Telephone Encounter (Signed)
That is fine, can just do the [place].

## 2014-07-28 ENCOUNTER — Encounter: Payer: Self-pay | Admitting: Medical

## 2014-07-28 ENCOUNTER — Ambulatory Visit (INDEPENDENT_AMBULATORY_CARE_PROVIDER_SITE_OTHER): Payer: 59 | Admitting: Medical

## 2014-07-28 VITALS — BP 122/80 | HR 68 | Temp 98.0°F | Resp 16 | Wt 169.0 lb

## 2014-07-28 DIAGNOSIS — R21 Rash and other nonspecific skin eruption: Secondary | ICD-10-CM

## 2014-07-28 MED ORDER — TRIAMCINOLONE ACETONIDE 0.1 % EX CREA: 1.0000 "application " | TOPICAL_CREAM | Freq: Two times a day (BID) | CUTANEOUS | Status: DC

## 2014-07-28 MED ORDER — CEPHALEXIN 500 MG PO CAPS: 500.0000 mg | ORAL_CAPSULE | Freq: Three times a day (TID) | ORAL | Status: DC

## 2014-07-28 MED ORDER — CEPHALEXIN 500 MG PO CAPS
500.0000 mg | ORAL_CAPSULE | Freq: Three times a day (TID) | ORAL | Status: DC
Start: 2014-07-28 — End: 2014-07-28
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
## 2014-08-14 ENCOUNTER — Encounter: Payer: Self-pay | Admitting: Medical

## 2014-08-14 ENCOUNTER — Ambulatory Visit (INDEPENDENT_AMBULATORY_CARE_PROVIDER_SITE_OTHER): Payer: 59 | Admitting: Medical

## 2014-08-14 VITALS — BP 100/70 | HR 67 | Temp 97.7°F | Wt 165.0 lb

## 2014-08-14 DIAGNOSIS — L500 Allergic urticaria: Secondary | ICD-10-CM

## 2014-08-14 DIAGNOSIS — R21 Rash and other nonspecific skin eruption: Secondary | ICD-10-CM

## 2014-08-14 MED ORDER — METFORMIN HCL 1000 MG PO TABS
1000.0000 mg | ORAL_TABLET | Freq: Two times a day (BID) | ORAL | Status: DC
Start: 2014-08-14 — End: 2014-08-14

## 2014-08-14 MED ORDER — METFORMIN HCL 1000 MG PO TABS: 1000.0000 mg | ORAL_TABLET | Freq: Two times a day (BID) | ORAL | Status: DC

## 2014-08-14 NOTE — Patient Instructions (Signed)
Rash  For now STOP Kombiglyze  Continue Benadryl OTC up to 3-4 times daily if needed  Start Metformin generic 1042m, 1 tablet daily at breakfast for now  Continue to monitor glucose, goal <130 in the morning.  Call back within a week to update me on symptoms  If rash completely goes away, we may assume that the [place] the [place].    However, if no improvement at all by end of the week, then let me know   Hives Hives are itchy, red, swollen areas of the skin. They can vary in size and location on your body. Hives can come and go for hours or several days (acute hives) or for several weeks (chronic hives). Hives do not spread from person to person (noncontagious). They may get worse with scratching, exercise, and emotional stress. CAUSES   Allergic reaction to food, additives, or drugs.  Infections, including the common cold.  Illness, such as vasculitis, lupus, or thyroid disease.  Exposure to sunlight, heat, or cold.  Exercise.  Stress.  Contact with chemicals. SYMPTOMS   Red or white swollen patches on the skin. The patches may change size, shape, and location quickly and repeatedly.  Itching.  Swelling of the hands, feet, and face. This may occur if hives develop deeper in the skin. DIAGNOSIS  Your caregiver can usually tell what is wrong by performing a physical exam. Skin or blood tests may also be done to determine the cause of your hives. In some cases, the cause cannot be determined. TREATMENT  Mild cases usually get better with medicines such as antihistamines. Severe cases may require an emergency epinephrine injection. If the cause of your hives is known, treatment includes avoiding that trigger.  HOME CARE INSTRUCTIONS   Avoid causes that trigger your hives.  Take antihistamines as directed by your caregiver to reduce the severity of your hives. Non-sedating or low-sedating antihistamines are usually recommended. Do not drive while taking an  antihistamine.  Take any other medicines prescribed for itching as directed by your caregiver.  Wear loose-fitting clothing.  Keep all follow-up appointments as directed by your caregiver. SEEK MEDICAL CARE IF:   You have persistent or severe itching that is not relieved with medicine.  You have painful or swollen joints. SEEK IMMEDIATE MEDICAL CARE IF:   You have a fever.  Your tongue or lips are swollen.  You have trouble breathing or swallowing.  You feel tightness in the throat or chest.  You have abdominal pain. These problems may be the first sign of a life-threatening allergic reaction. Call your local emergency services (911 in [place] MAKE SURE YOU:   Understand these instructions.  Will watch your condition.  Will get help right away if you are not doing well or get worse. Document Released: 12/01/2005 Document Revised: 12/06/2013 Document Reviewed: 02/24/2012 [facility], LLCPatient Information 2015 [place] This information is not intended to replace advice given to you by your health care provider. Make sure you discuss any questions you have with your health care provider.

## 2014-08-14 NOTE — Progress Notes (Signed)
Subjective:   Dean Wallace is a 43 y.o. male who presents for recheck on rash. i saw him recently for the same but rash is worse.   Initially it was involving the axilla initially then spread to right upper arm, left forearms, upper left shoulder and legs. Lesions are pink/red, and raised in texture. Rash has changed over time. Rash is pruritic. Associated symptoms: none. Patient denies: abdominal pain, arthralgia, fever, headache and irritability. Since last visit rash has spread to forearms, lower back, abdomen.  Patient has not had contacts with similar rash. Patient has not had new exposure.   Benadryl helps better than the steroid cream.   The following portions of the patient's history were reviewed and updated as appropriate: allergies, current medications, past family history, past medical history, past social history and problem list.  Review of Systems As in subjective above   Objective:   Gen: wd, wn, nad Skin: large patches of contiguous pink urticarial rash along bilat forearms, upper arms, lower back broadly, and lower abdomen.     Assessment:   Encounter Diagnoses  Name Primary?  . Allergic urticaria Yes  . Rash and nonspecific skin eruption      Plan:   Discussed symptoms and exam findings.  Rash is worse and seems to be urticarial.   We discussed diagnosis, possible causes.  There are no specific likely triggers.  His current only medication is Kombiglyze.  We will stop this for now.  He will c/t to monitor glucose, c/t healthy diet and exercise.   C/t benadryl, can add Zantac BID short term, and call back by Friday.  If rash completely resolves, we will add back Metformin only to see how he responds.

## 2014-08-22 ENCOUNTER — Telehealth: Payer: Self-pay | Admitting: Medical

## 2014-08-22 NOTE — Telephone Encounter (Signed)
Patient is aware of [facility] recommendations. CLS

## 2014-08-22 NOTE — Telephone Encounter (Signed)
Amazing, but glad to hear rash has resolved.  At this point, have him start plain metformin maybe just once daily if glucose staying under 130 fasting.    If rash restarts within a week, then we will assume it was the metformin.  If not, then we will blame the rash on the [facility]

## 2014-10-25 ENCOUNTER — Telehealth: Payer: Self-pay | Admitting: Internal Medicine

## 2014-10-25 NOTE — Telephone Encounter (Signed)
Is this okay to send? I was unsure of when he is due for next med check-none scheduled. Thanks.

## 2014-10-25 NOTE — Telephone Encounter (Signed)
[facility] for [facility] 10[facility]m [facility]

## 2014-10-26 ENCOUNTER — Other Ambulatory Visit: Payer: Self-pay | Admitting: Medical

## 2014-10-26 MED ORDER — METFORMIN HCL 500 MG PO TABS: 500.0000 mg | ORAL_TABLET | Freq: Every day | ORAL | Status: DC

## 2014-10-26 NOTE — Telephone Encounter (Signed)
Received refill request on Metformin.  Call and make sure he is just on Metformin 1044m BID and not having problems with this?  Also see if home glucose readings are still looking good?

## 2014-10-26 NOTE — Telephone Encounter (Signed)
Spoke with patient, he is only using half of 1000 mg tablet once a day. His readings are in am 60-70, in pm 80-90. He would like to know if he can discontinue Metformin and when do you want to see him again? Please advise about refill as well.

## 2014-10-26 NOTE — Telephone Encounter (Signed)
LM to [facility]

## 2014-10-26 NOTE — Telephone Encounter (Signed)
i sent metformin 5100m, for once daily use, and yes would c/t this for now.   plan recheck in 316mo

## 2014-10-27 NOTE — Telephone Encounter (Signed)
Patient is aware of the message

## 2014-11-16 ENCOUNTER — Telehealth: Payer: Self-pay | Admitting: Internal Medicine

## 2014-11-16 MED ORDER — METFORMIN HCL 500 MG PO TABS: 500.0000 mg | ORAL_TABLET | Freq: Every day | ORAL | Status: DC

## 2014-11-16 NOTE — Telephone Encounter (Signed)
[facility]

## 2015-01-26 ENCOUNTER — Other Ambulatory Visit: Payer: Self-pay | Admitting: Medical

## 2015-02-02 ENCOUNTER — Ambulatory Visit (INDEPENDENT_AMBULATORY_CARE_PROVIDER_SITE_OTHER): Payer: 59 | Admitting: Medical

## 2015-02-02 ENCOUNTER — Other Ambulatory Visit: Payer: Self-pay | Admitting: Family Medicine

## 2015-02-02 ENCOUNTER — Encounter: Payer: Self-pay | Admitting: Medical

## 2015-02-02 ENCOUNTER — Telehealth: Payer: Self-pay | Admitting: Medical

## 2015-02-02 VITALS — BP 110/60 | HR 75 | Temp 98.4°F | Resp 15 | Wt 141.0 lb

## 2015-02-02 DIAGNOSIS — Z8709 Personal history of other diseases of the respiratory system: Secondary | ICD-10-CM

## 2015-02-02 DIAGNOSIS — E119 Type 2 diabetes mellitus without complications: Secondary | ICD-10-CM

## 2015-02-02 DIAGNOSIS — Z23 Encounter for immunization: Secondary | ICD-10-CM

## 2015-02-02 LAB — BASIC METABOLIC PANEL
BUN: 16 mg/dL (ref 6–23)
CO2: 30 meq/L (ref 19–32)
Calcium: 9.6 mg/dL (ref 8.4–10.5)
Chloride: 104 mEq/L (ref 96–112)
Creat: 0.79 mg/dL (ref 0.50–1.35)
Glucose, Bld: 96 mg/dL (ref 70–99)
Potassium: 4.7 mEq/L (ref 3.5–5.3)
SODIUM: 142 meq/L (ref 135–145)

## 2015-02-02 LAB — LIPID PANEL
CHOLESTEROL: 169 mg/dL (ref 0–200)
HDL: 53 mg/dL (ref >39)
LDL Cholesterol: 105 mg/dL — ABNORMAL HIGH (ref 0–99)
TRIGLYCERIDES: 53 mg/dL (ref <150)
Total CHOL/HDL Ratio: 3.2 Ratio
VLDL: 11 mg/dL (ref 0–40)

## 2015-02-02 LAB — POCT GLYCOSYLATED HEMOGLOBIN (HGB A1C): Hemoglobin A1C: 4.8

## 2015-02-02 MED ORDER — ALPRAZOLAM 0.5 MG PO TABS: 0.5000 mg | ORAL_TABLET | Freq: Every evening | ORAL | Status: DC | PRN

## 2015-02-02 NOTE — Progress Notes (Signed)
  Subjective:   Dean Wallace is an 44 y.o. male who presents for follow up of Type 2 diabetes mellitus.   Patient is checking home blood sugars.   Home blood sugar records: 60 to 92   Current symptoms include: none.  Patient is checking their feet daily. Foot concerns (callous, ulcer, wound, thickened nails, toenail fungus, skin fungus, hammer toe): no concerns Last dilated eye exam 2 years ago  Current treatments: none. Medication compliance: good  Current diet: in general, a "healthy" diet   Current exercise: running/ jogging and walking Known diabetic complications: none  The following portions of the patient's history were reviewed and updated as appropriate: allergies, current medications, past family history, past medical history, past social history, past surgical history and problem list.  ROS as in subjective above    Objective:   Filed Vitals:   02/02/15 1144  BP: 110/60  Pulse: 75  Temp: 98.4 F (36.9 C)  Resp: 15   General appearance: alert, no distress, WD/WN  Oral cavity: MMM, no lesions Neck: supple, no lymphadenopathy, no thyromegaly, no masses Heart: RRR, normal S1, S2, no murmurs Lungs: CTA bilaterally, no wheezes, rhonchi, or rales Abdomen: +bs, soft, non tender, non distended, no masses, no hepatomegaly, no splenomegaly Pulses: 2+ symmetric, upper and lower extremities, normal cap refill Ext: no edema See separate foot exam     Assessment:   Encounter Diagnoses  Name Primary?  . Diabetes type 2, controlled Yes  . Need for prophylactic vaccination and inoculation against influenza   . Need for prophylactic vaccination against Streptococcus pneumoniae (pneumococcus)   . Need for Tdap vaccination   . History of pleural empyema      Plan:   Diabetes type 2-he has essentially reversed course with his diagnosis with a hemoglobin A1c of 4.8% today.  As he stop metformin, stop checking glucoses regularly.  Advise he continue check his feet  regularly, see eye doctor and dentist yearly, continue with healthy diet and exercise as part of his routine now, come in yearly for physical, maintaining goals to keep him on this past.    Status post empyema with hospitalization this past year after diabetes had become uncontrolled  Counseled on the Tdap (tetanus, diptheria, and acellular pertussis) vaccine.  Vaccine information sheet given. Tdap vaccine given after consent obtained.  Counseled on the influenza virus vaccine.  Vaccine information sheet given.  Influenza vaccine given after consent obtained.  Counseled on the pneumococcal vaccine.  Vaccine information sheet given.  Pneumococcal vaccine PPSV 23 given after consent obtained.  Follow-up pending labs

## 2015-02-02 NOTE — Telephone Encounter (Signed)
I fax over his Rx for [facility]ax [facility]

## 2015-02-02 NOTE — Telephone Encounter (Signed)
pls call out xanax when you get a chance

## 2015-02-05 ENCOUNTER — Ambulatory Visit: Payer: Self-pay | Admitting: Medical

## 2016-03-11 IMAGING — CR DG CHEST 2V
2 series · 2 of 2 positions shown · non-contrast
Comparison: none

CLINICAL DATA: Preop.

EXAM:
CHEST  2 VIEW

[w chest lat]
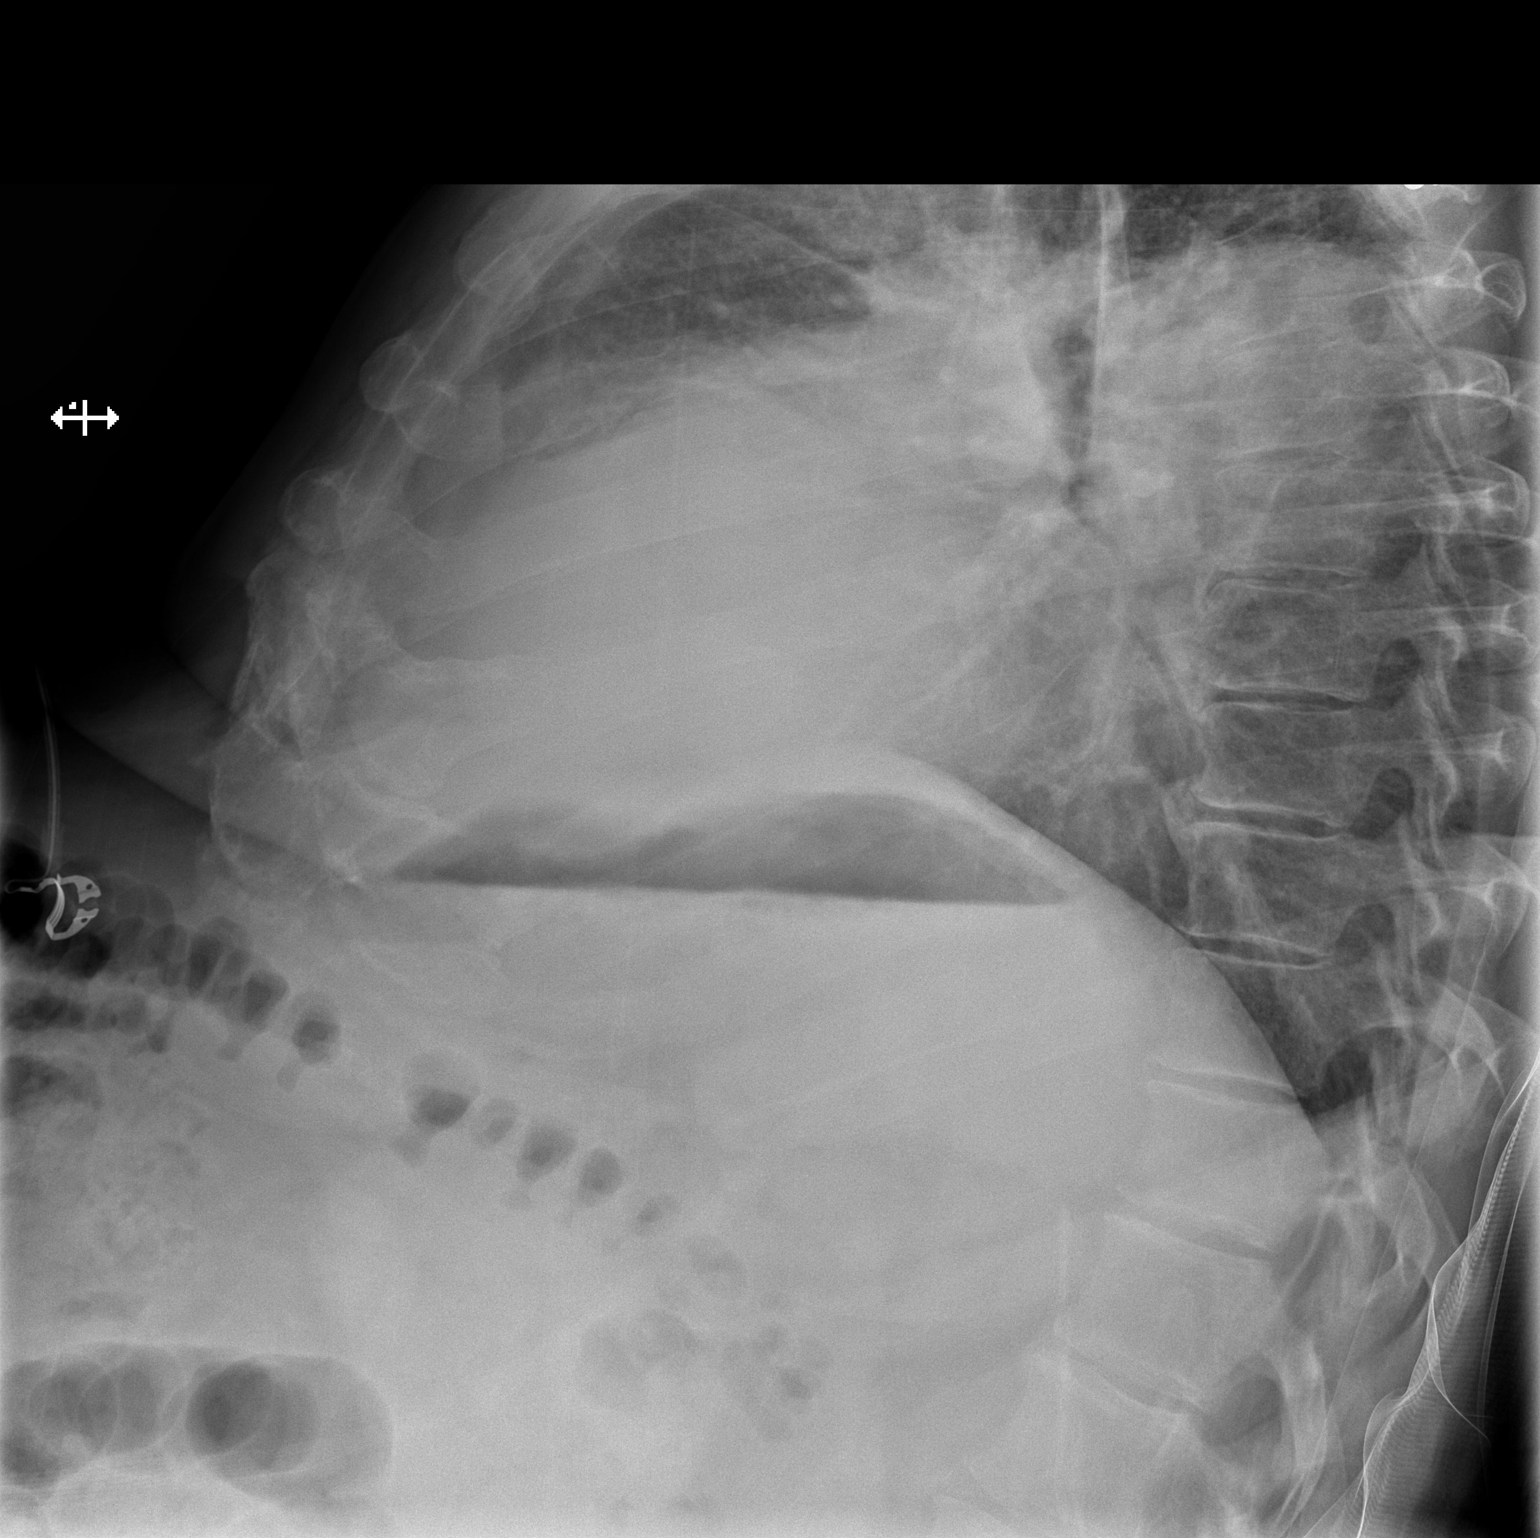

[x chest ap]
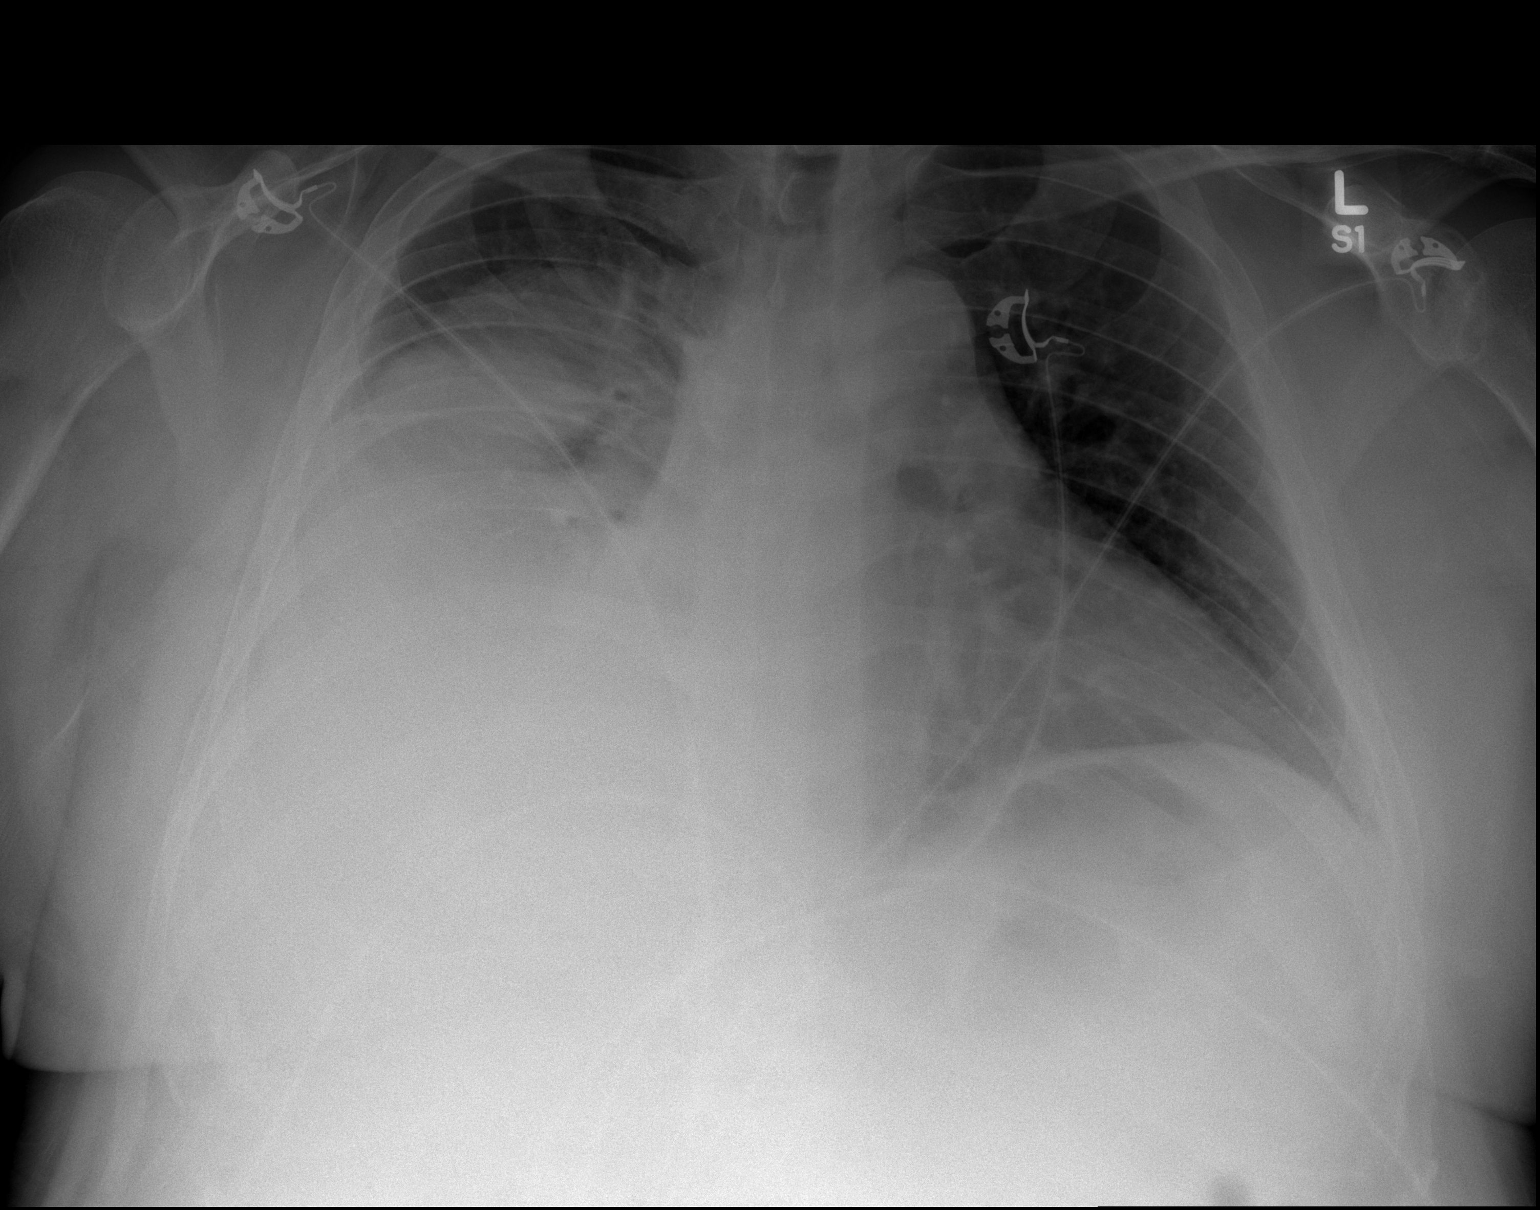

[2 of 2 positions shown; findings below may reference images not displayed]

FINDINGS: Severe right lung airspace disease and right effusion again noted,
not significantly changed. Heart is mildly enlarged. Left lung is
clear. Low lung volumes. No acute bony abnormality.
IMPRESSION: No significant change in the right lung opacity in right effusion.

## 2016-03-15 IMAGING — CR DG CHEST 1V PORT
1 series · 1 of 1 positions shown · non-contrast
Comparison: March 14, 2014

CLINICAL DATA: Postoperative decortication procedure

EXAM:
PORTABLE CHEST - 1 VIEW

[AP]
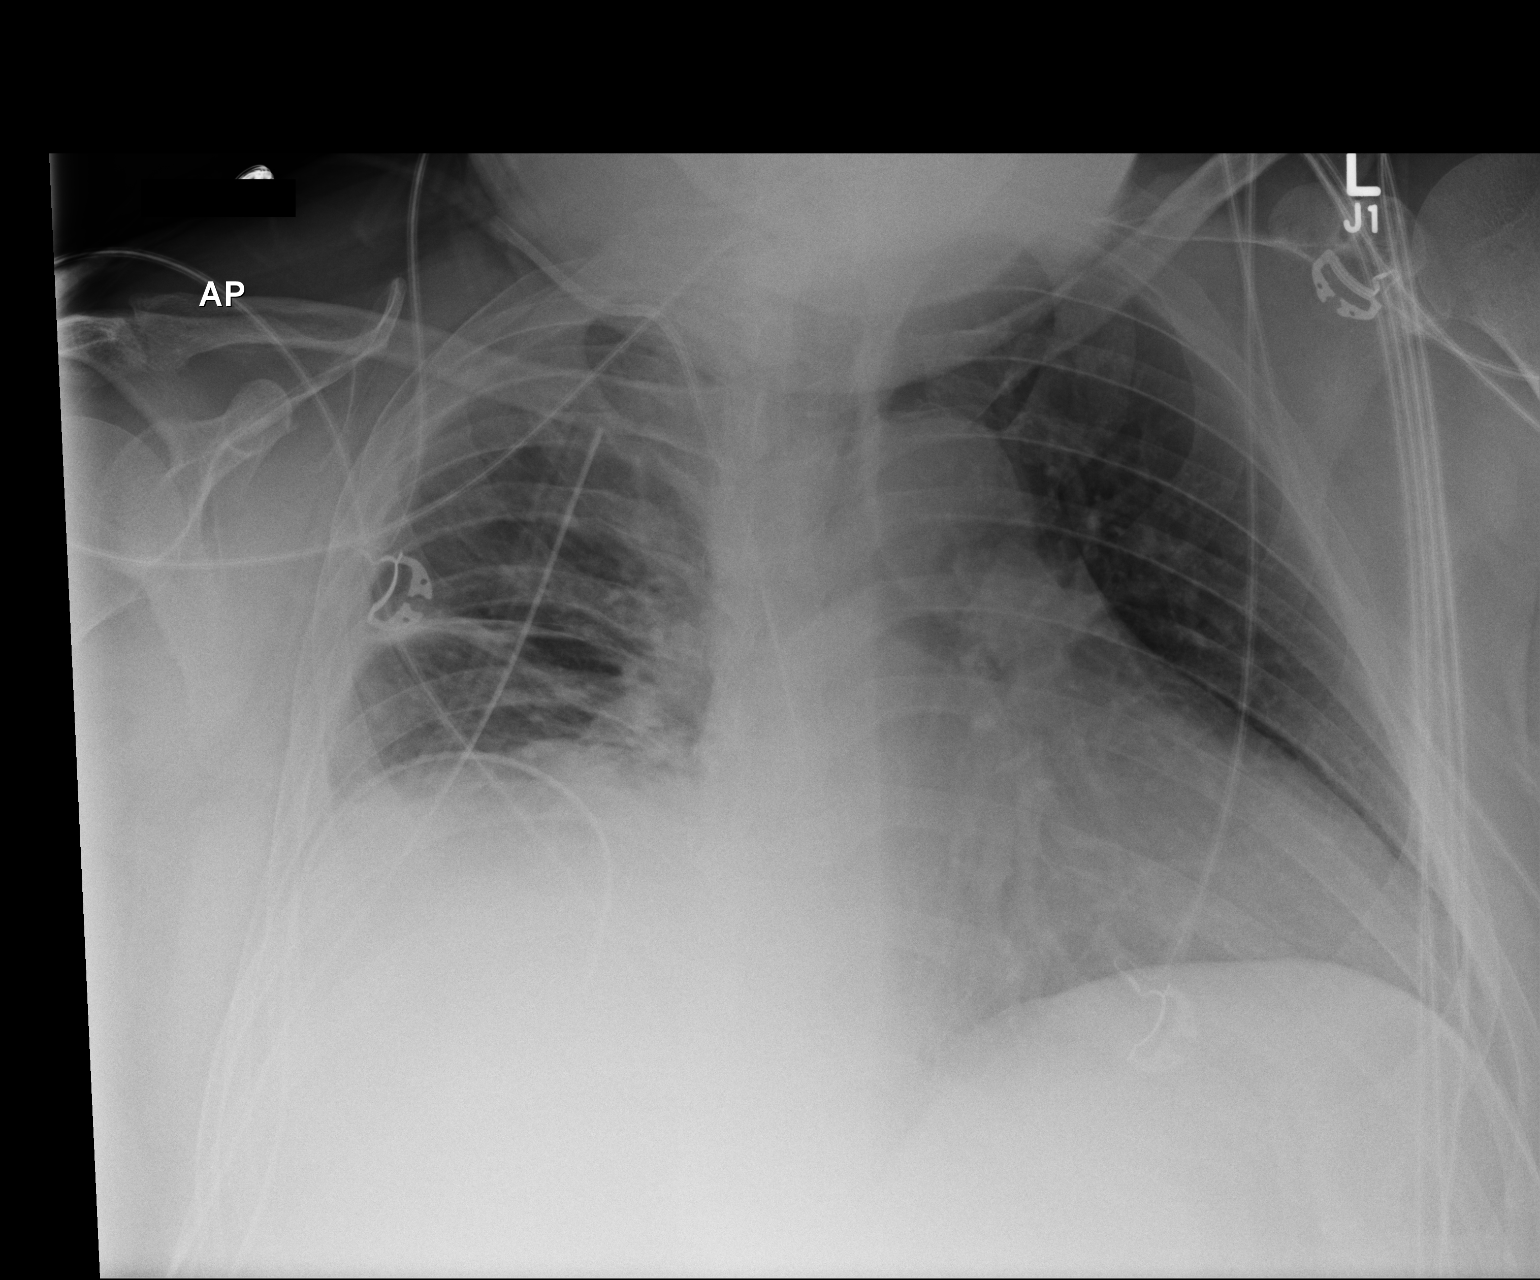

[1 of 1 positions shown; findings below may reference images not displayed]

FINDINGS: The central catheter tip is at the cavoatrial junction. There is a
chest tube on the right. There is no appreciable pneumothorax.

There is persistent elevation the right hemidiaphragm with
atelectatic change in the right lower lobe. Left lung is clear.
Heart is prominent but stable. Pulmonary vascularity is normal. No
adenopathy.
IMPRESSION: Persistent elevation of the right hemidiaphragm with right lower
lobe atelectatic change. No new opacity. Left lung clear. Tube and
catheter positions unchanged without appreciable pneumothorax.

## 2016-05-07 ENCOUNTER — Ambulatory Visit (INDEPENDENT_AMBULATORY_CARE_PROVIDER_SITE_OTHER): Payer: 59 | Admitting: Medical

## 2016-05-07 ENCOUNTER — Encounter: Payer: Self-pay | Admitting: Medical

## 2016-05-07 VITALS — BP 120/78 | HR 64 | Ht 64.0 in | Wt 144.0 lb

## 2016-05-07 DIAGNOSIS — Z0000 Encounter for general adult medical examination without abnormal findings: Secondary | ICD-10-CM | POA: Insufficient documentation

## 2016-05-07 DIAGNOSIS — Z125 Encounter for screening for malignant neoplasm of prostate: Secondary | ICD-10-CM

## 2016-05-07 DIAGNOSIS — Z8042 Family history of malignant neoplasm of prostate: Secondary | ICD-10-CM

## 2016-05-07 DIAGNOSIS — Z113 Encounter for screening for infections with a predominantly sexual mode of transmission: Secondary | ICD-10-CM | POA: Insufficient documentation

## 2016-05-07 DIAGNOSIS — Z8639 Personal history of other endocrine, nutritional and metabolic disease: Secondary | ICD-10-CM

## 2016-05-07 LAB — POCT URINALYSIS DIPSTICK
BILIRUBIN UA: NEGATIVE
GLUCOSE UA: NEGATIVE
Ketones, UA: NEGATIVE
Leukocytes, UA: NEGATIVE
Nitrite, UA: NEGATIVE
Protein, UA: NEGATIVE
RBC UA: NEGATIVE
Spec Grav, UA: >=1.03
Urobilinogen, UA: NEGATIVE
pH, UA: 6

## 2016-05-07 LAB — COMPREHENSIVE METABOLIC PANEL
ALK PHOS: 58 U/L (ref 40–115)
ALT: 17 U/L (ref 9–46)
AST: 16 U/L (ref 10–40)
Albumin: 4.5 g/dL (ref 3.6–5.1)
BILIRUBIN TOTAL: 0.5 mg/dL (ref 0.2–1.2)
BUN: 15 mg/dL (ref 7–25)
CALCIUM: 9.3 mg/dL (ref 8.6–10.3)
CO2: 28 mmol/L (ref 20–31)
CREATININE: 0.66 mg/dL (ref 0.60–1.35)
Chloride: 104 mmol/L (ref 98–110)
Glucose, Bld: 83 mg/dL (ref 65–99)
Potassium: 4.3 mmol/L (ref 3.5–5.3)
SODIUM: 141 mmol/L (ref 135–146)
Total Protein: 6.8 g/dL (ref 6.1–8.1)

## 2016-05-07 LAB — HEMOGLOBIN A1C
Hgb A1c MFr Bld: 5.1 % (ref <5.7)
Mean Plasma Glucose: 100 mg/dL

## 2016-05-07 LAB — LIPID PANEL
Cholesterol: 164 mg/dL (ref 125–200)
HDL: 71 mg/dL (ref >=40)
LDL CALC: 85 mg/dL (ref <130)
Total CHOL/HDL Ratio: 2.3 Ratio (ref <=5.0)
Triglycerides: 41 mg/dL (ref <150)
VLDL: 8 mg/dL (ref <30)

## 2016-05-07 LAB — CBC
HCT: 40.7 % (ref 38.5–50.0)
Hemoglobin: 13.8 g/dL (ref 13.2–17.1)
MCH: 31.5 pg (ref 27.0–33.0)
MCHC: 33.9 g/dL (ref 32.0–36.0)
MCV: 92.9 fL (ref 80.0–100.0)
MPV: 9.7 fL (ref 7.5–12.5)
Platelets: 234 10*3/uL (ref 140–400)
RBC: 4.38 MIL/uL (ref 4.20–5.80)
RDW: 13 % (ref 11.0–15.0)
WBC: 6.8 10*3/uL (ref 4.0–10.5)

## 2016-05-07 NOTE — Progress Notes (Signed)
Subjective:   HPI  Dean Wallace is a 45 y.o. male who presents for a complete physical.  Concerns: Exercising regularly, runs 3-5 days per week, goes to the gym on days he can't run.  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  Reviewed their medical, surgical, family, social, medication, and allergy history and updated chart as appropriate.  Past Medical History  Diagnosis Date  . Diabetes mellitus without complication ([place])   . Empyema lung ([place]) 02/2014    VATS surgery    Past Surgical History  Procedure Laterality Date  . Lung drained    . Video assisted thoracoscopy (vats)/empyema Right 03/12/2014    Procedure: VIDEO ASSISTED THORACOSCOPY (VATS)/EMPYEMA;  Surgeon: Melrose Nakayama, MD;  Location: [place];  Service: Thoracic;  Laterality: Right;  . Decortication Right 03/12/2014    Procedure: DECORTICATION;  Surgeon: Melrose Nakayama, MD;  Location: [place];  Service: Thoracic;  Laterality: Right;    Social History   Social History  . Marital Status: Single    Spouse Name: N/A  . Number of Children: N/A  . Years of Education: N/A   Occupational History  . Not on file.   Social History Main Topics  . Smoking status: Never Smoker   . Smokeless tobacco: Not on file  . Alcohol Use: No  . Drug Use: No  . Sexual Activity: Yes   Other Topics Concern  . Not on file   Social History Narrative   Male partner, exercising regularly, running, unemployed currently.  Was working for [employer].  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   As of 04/2016    Family History  Problem Relation Age of Onset  . Cancer Father 17    prostate  . Heart disease Father 30    CHF, MI   . Hypertension Father   . COPD Mother   . Heart disease Mother     CHF  . Hypertension Mother   . Cancer Mother     skin  . Diabetes Brother   . Arthritis Sister   . Cancer Maternal Grandmother     breast     Current outpatient prescriptions:  .   ALPRAZolam (XANAX) 0.5 MG tablet, Take 1 tablet (0.5 mg total) by mouth at bedtime as needed for anxiety. (Patient not taking: Reported on 05/07/2016), Disp: 45 tablet, Rfl: 1 .  Blood Glucose Monitoring Suppl (ONE TOUCH ULTRA SYSTEM KIT) W/DEVICE KIT, 1 kit by Does not apply route once. Reported on 05/07/2016, Disp: , Rfl:  .  glucose blood test strip, Reported on 05/07/2016, Disp: , Rfl:  .  Insulin Pen Needle 32G X 4 MM MISC, Use pen needles with levemir and novolog pen to dispense your insulin (Patient not taking: Reported on 05/07/2016), Disp: 300 each, Rfl: 0  No Known Allergies   Review of Systems Constitutional: -fever, -chills, -sweats, -unexpected weight change, -decreased appetite, -fatigue Allergy: -sneezing, -itching, -congestion Dermatology: -changing moles, --rash, -lumps ENT: -runny nose, -ear pain, -sore throat, -hoarseness, -sinus pain, -teeth pain, - ringing in ears, -hearing loss, -nosebleeds Cardiology: -chest pain, -palpitations, -swelling, -difficulty breathing when lying flat, -waking up short of breath Respiratory: -cough, -shortness of breath, -difficulty breathing with exercise or exertion, -wheezing, -coughing up blood Gastroenterology: -abdominal pain, -nausea, -vomiting, -diarrhea, -constipation, -blood in stool, -changes in bowel movement, -difficulty swallowing or eating Hematology: -bleeding, -bruising  Musculoskeletal: -joint aches, -muscle aches, -joint swelling, -back pain, -neck pain, -cramping, -changes in gait Ophthalmology: denies vision  changes, eye redness, itching, discharge Urology: -burning with urination, -difficulty urinating, -blood in urine, -urinary frequency, -urgency, -incontinence Neurology: -headache, -weakness, -tingling, -numbness, -memory loss, -falls, -dizziness Psychology: -depressed mood, -agitation, -sleep problems     Objective:   Physical Exam  BP 120/78 mmHg  Pulse 64  Ht _0  (1.626 m)  Wt 144 lb (65.318 kg)  BMI 24.71  kg/m2  General appearance: alert, no distress, WD/WN, white male Skin:few scattered macules, no worrisome lesions HEENT: normocephalic, conjunctiva/corneas normal, sclerae anicteric, PERRLA, EOMi, nares patent, no discharge or erythema, pharynx normal Oral cavity: MMM, tongue normal, teeth normal Neck: supple, no lymphadenopathy, no thyromegaly, no masses, normal ROM, no bruits Chest: non tender, normal shape and expansion Heart: RRR, normal S1, S2, no murmurs Lungs: CTA bilaterally, no wheezes, rhonchi, or rales Abdomen: +bs, soft, non tender, non distended, no masses, no hepatomegaly, no splenomegaly, no bruits Back: non tender, normal ROM, no scoliosis Musculoskeletal: upper extremities non tender, no obvious deformity, normal ROM throughout, lower extremities non tender, no obvious deformity, normal ROM throughout Extremities: no edema, no cyanosis, no clubbing Pulses: 2+ symmetric, upper and lower extremities, normal cap refill Neurological: alert, oriented x 3, CN2-12 intact, strength normal upper extremities and lower extremities, sensation normal throughout, DTRs 2+ throughout, no cerebellar signs, gait normal Psychiatric: normal affect, behavior normal, pleasant  GU: normal male external genitalia, circumcised, nontender, no masses, no hernia, no lymphadenopathy Rectal: deferred    Assessment and Plan :      Encounter Diagnoses  Name Primary?  . Routine general medical examination at a health care facility Yes  . History of diabetes mellitus   . Family history of prostate cancer   . Screening for prostate cancer     Physical exam - discussed healthy lifestyle, diet, exercise, preventative care, vaccinations, and addressed their concerns.   Advised monthly self testicular exam PSA baseline screen Glad to hear he is doing well Vaccines reviewed Follow-up pending labs

## 2016-05-08 LAB — PSA: PSA: 0.97 ng/mL (ref <=4.00)

## 2016-05-28 ENCOUNTER — Encounter: Payer: Self-pay | Admitting: Medical

## 2016-05-28 ENCOUNTER — Ambulatory Visit (INDEPENDENT_AMBULATORY_CARE_PROVIDER_SITE_OTHER): Payer: 59 | Admitting: Medical

## 2016-05-28 VITALS — BP 118/74 | HR 66 | Temp 97.1°F | Wt 145.0 lb

## 2016-05-28 DIAGNOSIS — R05 Cough: Secondary | ICD-10-CM | POA: Diagnosis not present

## 2016-05-28 DIAGNOSIS — E118 Type 2 diabetes mellitus with unspecified complications: Secondary | ICD-10-CM

## 2016-05-28 DIAGNOSIS — J988 Other specified respiratory disorders: Secondary | ICD-10-CM

## 2016-05-28 DIAGNOSIS — R059 Cough, unspecified: Secondary | ICD-10-CM

## 2016-05-28 MED ORDER — LEVOFLOXACIN 500 MG PO TABS: 500.0000 mg | ORAL_TABLET | Freq: Every day | ORAL | Status: DC

## 2016-05-28 MED ORDER — ALPRAZOLAM 0.5 MG PO TABS: 0.5000 mg | ORAL_TABLET | Freq: Every evening | ORAL | Status: DC | PRN

## 2016-05-28 MED ORDER — GLUCOSE BLOOD VI STRP: 1.0000 | ORAL_STRIP | Status: DC | PRN

## 2016-05-28 NOTE — Progress Notes (Signed)
Subjective:  Dean Wallace is a 45 y.o. male who presents for possible sinus infection.    Has had several day hx/o sore throat, then started getting some congestion in head and chest.  Using some benadryl, but sinus pressure and post nasal drainage, with constant tickle and cough has worsened.   Nonsmoker.   Is a little better today.  Had low grade fever.   No NVD, no wheezing, but felt a little SOB yesterday. No sick contacts.   Also would like refill on xanax prn and needs test strip script renewed.  No other aggravating or relieving factors.  No other c/o.  Past Medical History  Diagnosis Date  . Diabetes mellitus without complication ([place])   . 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    VATS surgery   ROS as in subjective   Objective: BP 118/74 mmHg  Pulse 66  Temp(Src) 97.1 F (36.2 C) (Tympanic)  Wt 145 lb (65.772 kg)  General appearance: Alert, WD/WN, no distress                             Skin: warm, no rash                           Head: + mild frontal sinus tenderness,                            Eyes: conjunctiva normal, corneas clear, PERRLA                            Ears: pearly TMs, external ear canals normal                          Nose: septum midline, turbinates swollen, with erythema and mucoid discharge             Mouth/throat: MMM, tongue normal, mild pharyngeal erythema                           Neck: supple, no adenopathy, no thyromegaly, non tender                          Heart: RRR, normal S1, S2, no murmurs                         Lungs: right lower lung field with slight dullness, otherwise no wheezes, rales, or rhonchi      Assessment  Encounter Diagnoses  Name Primary?  Marland Kitchen Respiratory tract infection Yes  . Cough   . Type 2 diabetes mellitus with complication, without long-term current use of insulin ([facility])       Plan: Begin Levaquin, rest, hydrate well, can c/t the Alka Seltzer OTC, and if worse or not improving by end of the week, then  recheck.  Refilled xanax and test strips.  Dean Wallace was seen today for sinus infection?.  Diagnoses and all orders for this visit:  Respiratory tract infection  Cough  Type 2 diabetes mellitus with complication, without long-term current use of insulin ([place])  Other orders -     levofloxacin (LEVAQUIN) 500 MG tablet; Take 1 tablet (500 mg total) by mouth daily. -     ALPRAZolam (XANAX) 0.5 MG  tablet; Take 1 tablet (0.5 mg total) by mouth at bedtime as needed for anxiety. -     glucose blood test strip; 1 each by Other route as needed for other. Check glucose daily prn   Patient was advised to call or return if worse or not improving in the next few days.    Patient voiced understanding of diagnosis, recommendations, and treatment plan.

## 2017-03-12 ENCOUNTER — Encounter: Payer: Self-pay | Admitting: Family Medicine

## 2017-03-12 ENCOUNTER — Ambulatory Visit (INDEPENDENT_AMBULATORY_CARE_PROVIDER_SITE_OTHER): Payer: Managed Care, Other (non HMO) | Admitting: Family Medicine

## 2017-03-12 VITALS — BP 132/82 | HR 67 | Temp 98.3°F | Resp 16 | Wt 169.0 lb

## 2017-03-12 DIAGNOSIS — R059 Cough, unspecified: Secondary | ICD-10-CM

## 2017-03-12 DIAGNOSIS — Z113 Encounter for screening for infections with a predominantly sexual mode of transmission: Secondary | ICD-10-CM | POA: Diagnosis not present

## 2017-03-12 DIAGNOSIS — R05 Cough: Secondary | ICD-10-CM | POA: Diagnosis not present

## 2017-03-12 MED ORDER — AZITHROMYCIN 250 MG PO TABS: ORAL_TABLET | ORAL | 0 refills | Status: DC

## 2017-03-12 NOTE — Patient Instructions (Signed)
Stay well hydrated. Continue taking Mucinex for cough and congestion. If you are not back to baseline day 10 after starting the Z-pack (this is for 5 days only) then let me know.

## 2017-03-12 NOTE — Progress Notes (Signed)
Subjective: Chief Complaint  Patient presents with  . cough    persistant cough for a week, drainage, and coughing up mucous, sore throat first couple days but not now     Dean Wallace is a 46 y.o. male who presents for a 8-9 day history of dry cough and post nasal drainage.  Reports history of pneumonia and collapsed lung 3 years ago.  Does not smoke.  No recent antibiotics.  Denies fever, chills, dizziness, fatigue, ear pain, sinus pain, rhinorrhea, sore throat, chest pain, palpitations, abdominal pain, N/V/D, LE edema.   Treatment to date: mucinex, cough drops.  Denies sick contacts.  No other aggravating or relieving factors.  No other c/o.  ROS as in subjective.  Requests STD testing. Denies having any symptoms but is newly single and would like to check for STD prior to starting a new relationship.    Objective: Vitals:   03/12/17 0936  BP: 132/82  Pulse: 67  Resp: 16  Temp: 98.3 F (36.8 C)    General appearance: Alert, WD/WN, no distress, mildly ill appearing                             Skin: warm, no rash                           Head: no sinus tenderness                            Eyes: conjunctiva normal, corneas clear, PERRLA                            Ears: pearly TMs, external ear canals normal                          Nose: septum midline, turbinates swollen, with erythema and clear discharge             Mouth/throat: MMM, tongue normal, mild pharyngeal erythema                           Neck: supple, no adenopathy, no thyromegaly, nontender                          Heart: RRR, normal S1, S2, no murmurs                         Lungs: CTA bilaterally, no wheezes, rales, or rhonchi      Assessment: Cough - Plan: azithromycin (ZITHROMAX Z-PAK) 250 MG tablet  Screen for STD (sexually transmitted disease) - Plan: RPR, HIV antibody, GC/Chlamydia Probe Amp    Plan: Plan to start him on Z-pack due to recent pneumonia history.  Suggested symptomatic OTC  remedies.  Mucinex for cough and congestion. Tylenol or Ibuprofen OTC for fever and malaise.  Call/return if not back to baseline in 10 days or if he worsens.  Will check for STDs and follow up pending results.

## 2017-03-13 LAB — HIV ANTIBODY (ROUTINE TESTING W REFLEX): HIV: NONREACTIVE

## 2017-03-13 LAB — GC/CHLAMYDIA PROBE AMP
CT Probe RNA: NOT DETECTED
GC Probe RNA: NOT DETECTED

## 2017-03-13 LAB — RPR

## 2017-10-09 DIAGNOSIS — H5213 Myopia, bilateral: Secondary | ICD-10-CM

## 2017-10-09 HISTORY — DX: Myopia, bilateral: H52.13

## 2017-12-17 ENCOUNTER — Encounter: Payer: Self-pay | Admitting: Family Medicine

## 2017-12-17 ENCOUNTER — Ambulatory Visit (INDEPENDENT_AMBULATORY_CARE_PROVIDER_SITE_OTHER): Payer: 59 | Admitting: Family Medicine

## 2017-12-17 VITALS — BP 130/82 | HR 76 | Temp 98.2°F | Resp 16 | Ht 65.0 in | Wt 194.6 lb

## 2017-12-17 DIAGNOSIS — J019 Acute sinusitis, unspecified: Secondary | ICD-10-CM | POA: Diagnosis not present

## 2017-12-17 DIAGNOSIS — R05 Cough: Secondary | ICD-10-CM | POA: Diagnosis not present

## 2017-12-17 DIAGNOSIS — R059 Cough, unspecified: Secondary | ICD-10-CM

## 2017-12-17 MED ORDER — AMOXICILLIN 875 MG PO TABS: 875.0000 mg | ORAL_TABLET | Freq: Two times a day (BID) | ORAL | 0 refills | Status: DC

## 2017-12-17 NOTE — Progress Notes (Signed)
Chief Complaint  Patient presents with  . Acute Visit    sinus/chest congestion and pressure since before Christmas.tried OTC medication no relief, coughing, sneezing, headache,     Subjective:  Dean Wallace is a 47 y.o. male who presents for possible sinus infection.  Symptoms include a 2 week history of maxillary sinus pressure and pain, nasal congestion that has worsened and he now has a dry cough for the past 2-3 days. States his upper teeth also hurt.   Denies fever, chills, ear pain, sore throat, chest pain, palpitations, shortness of breath, wheezing, abdominal pain, N/V/D.   Past history is significant for collasped lung and pneumonia on right. Patient is a non-smoker.  Using Muciinex for symptoms.  Denies sick contacts.  No other aggravating or relieving factors.  No other c/o.  ROS as in subjective   Objective: Vitals:   12/17/17 1444  BP: 130/82  Pulse: 76  Resp: 16  Temp: 98.2 F (36.8 C)  SpO2: 97%    General appearance: Alert, WD/WN, no distress                             Skin: warm, no rash                           Head: + frontal and maxillary sinus tenderness,                            Eyes: conjunctiva normal, corneas clear, PERRLA                            Ears: pearly TMs, external ear canals normal                          Nose: septum midline, turbinates swollen, with erythema and clear discharge             Mouth/throat: MMM, tongue normal, mild pharyngeal erythema                           Neck: supple, no adenopathy, no thyromegaly, nontender                          Heart: RRR, normal S1, S2, no murmurs                         Lungs: CTA bilaterally, no wheezes, rales, or rhonchi       Assessment and Plan: Acute sinusitis with symptoms > 10 days - Plan: amoxicillin (AMOXIL) 875 MG tablet  Cough   Prescription sent for Amoxicillin. Can use OTC Mucinex for congestion.  Tylenol or Ibuprofen OTC for fever and malaise.  Discussed symptomatic  relief, nasal saline flush, and call or return if worse or not back to baseline after completing the antibiotic.

## 2017-12-17 NOTE — Patient Instructions (Signed)
If you are not back to baseline after completing the antibiotic then let me know or if you get worse.

## 2017-12-30 ENCOUNTER — Other Ambulatory Visit: Payer: Self-pay | Admitting: Medical

## 2017-12-30 ENCOUNTER — Telehealth: Payer: Self-pay | Admitting: Medical

## 2017-12-30 MED ORDER — AMOXICILLIN-POT CLAVULANATE 875-125 MG PO TABS: 1.0000 | ORAL_TABLET | Freq: Two times a day (BID) | ORAL | 0 refills | Status: AC

## 2017-12-30 NOTE — Telephone Encounter (Signed)
If he feels significantly improved then I would say no he probably does not need any more antibiotic.  However if he still feels mild to moderate sinus pressure, teeth ache, fatigue or just not feeling fully resolved I did send a round of Augmentin antibiotic to the pharmacy.  Otherwise continue to hydrate well with water, he can use something like Mucinex DM and other 3-4 days to help clear this up.  Just know that a residual cough can hang around for weeks after the initial infection

## 2017-12-30 NOTE — Telephone Encounter (Signed)
Pt has finished the round of antibiotics given to him by Vickie. Vickie asked him to call back with an update after completing the antibiotic. He is still congested and still has a cough. Does he need more antibiotic?

## 2017-12-30 NOTE — Telephone Encounter (Signed)
Called and spoke with pt  He said that he feels about 80% better then he did before. He was just concerned about the cough because of his  hx of lung issues in the past. He said that he will try the mucinex dm for the 3-4 days and will call us back if doesn't help.

## 2018-08-12 ENCOUNTER — Encounter: Payer: Self-pay | Admitting: Family Medicine

## 2018-08-27 ENCOUNTER — Ambulatory Visit (INDEPENDENT_AMBULATORY_CARE_PROVIDER_SITE_OTHER): Payer: 59 | Admitting: Medical

## 2018-08-27 ENCOUNTER — Encounter: Payer: Self-pay | Admitting: Medical

## 2018-08-27 VITALS — BP 120/80 | HR 86 | Temp 97.7°F | Resp 16 | Ht 65.0 in | Wt 204.6 lb

## 2018-08-27 DIAGNOSIS — E118 Type 2 diabetes mellitus with unspecified complications: Secondary | ICD-10-CM

## 2018-08-27 DIAGNOSIS — Z7185 Encounter for immunization safety counseling: Secondary | ICD-10-CM

## 2018-08-27 DIAGNOSIS — Z113 Encounter for screening for infections with a predominantly sexual mode of transmission: Secondary | ICD-10-CM

## 2018-08-27 DIAGNOSIS — Z7189 Other specified counseling: Secondary | ICD-10-CM

## 2018-08-27 DIAGNOSIS — Z0000 Encounter for general adult medical examination without abnormal findings: Secondary | ICD-10-CM | POA: Diagnosis not present

## 2018-08-27 DIAGNOSIS — Z8042 Family history of malignant neoplasm of prostate: Secondary | ICD-10-CM

## 2018-08-27 DIAGNOSIS — Z1211 Encounter for screening for malignant neoplasm of colon: Secondary | ICD-10-CM

## 2018-08-27 DIAGNOSIS — Z2821 Immunization not carried out because of patient refusal: Secondary | ICD-10-CM

## 2018-08-27 DIAGNOSIS — Z125 Encounter for screening for malignant neoplasm of prostate: Secondary | ICD-10-CM

## 2018-08-27 LAB — POCT URINALYSIS DIP (PROADVANTAGE DEVICE)
Bilirubin, UA: NEGATIVE
GLUCOSE UA: NEGATIVE mg/dL
Ketones, POC UA: NEGATIVE mg/dL
LEUKOCYTES UA: NEGATIVE
NITRITE UA: NEGATIVE
PH UA: 7 (ref 5.0–8.0)
PROTEIN UA: NEGATIVE mg/dL
RBC UA: NEGATIVE
Specific Gravity, Urine: 1.01
Urobilinogen, Ur: NEGATIVE

## 2018-08-27 NOTE — Patient Instructions (Signed)
Thanks for trusting [place] with your health care and for coming in for a physical today.  Below are some general recommendations I have for you:  Yearly screenings See your eye doctor yearly for routine vision care. See your dentist yearly for routine dental care including hygiene visits twice yearly. See me here yearly for a routine physical and preventative care visit   Specific Concerns today:  . Check insurance coveragre for colonoscopy screening   Please follow up yearly for a physical.   Preventative Care for Adults - Male      [place]:  A routine yearly physical is a good way to check in with your primary care provider about your health and preventive screening. It is also an opportunity to share updates about your health and any concerns you have, and receive a thorough all-over exam.   Most health insurance companies pay for at least some preventative services.  Check with your health plan for specific coverages.  WHAT PREVENTATIVE SERVICES DO WOMEN NEED?  Adult men should have their weight and blood pressure checked regularly.   Men age 84 and older should have their cholesterol levels checked regularly.  Beginning at age 31 and continuing to age 56, men should be screened for colorectal cancer.  Certain people may need continued testing until age 67.  Updating vaccinations is part of preventative care.  Vaccinations help protect against diseases such as the flu.  Osteoporosis is a disease in which the bones lose minerals and strength as we age. Men ages 54 and over should discuss this with their caregivers  Lab tests are generally done as part of preventative care to screen for anemia and blood disorders, to screen for problems with the kidneys and liver, to screen for bladder problems, to check blood sugar, and to check your cholesterol level.  Preventative services generally include counseling about diet, exercise, avoiding tobacco, drugs,  excessive alcohol consumption, and sexually transmitted infections.    GENERAL RECOMMENDATIONS FOR GOOD HEALTH:  Healthy diet:  Eat a variety of foods, including fruit, vegetables, animal or vegetable protein, such as meat, fish, chicken, and eggs, or beans, lentils, tofu, and grains, such as rice.  Drink plenty of water daily.  Decrease saturated fat in the diet, avoid lots of red meat, processed foods, sweets, fast foods, and fried foods.  Exercise:  Aerobic exercise helps maintain good heart health. At least 30-40 minutes of moderate-intensity exercise is recommended. For example, a brisk walk that increases your heart rate and breathing. This should be done on most days of the week.   Find a type of exercise or a variety of exercises that you enjoy so that it becomes a part of your daily life.  Examples are running, walking, swimming, water aerobics, and biking.  For motivation and support, explore group exercise such as aerobic class, spin class, Zumba, Yoga,or  martial arts, etc.    Set exercise goals for yourself, such as a certain weight goal, walk or run in a race such as a 5k walk/run.  Speak to your primary care provider about exercise goals.  Disease prevention:  If you smoke or chew tobacco, find out from your caregiver how to quit. It can literally save your life, no matter how long you have been a tobacco user. If you do not use tobacco, never begin.   Maintain a healthy diet and normal weight. Increased weight leads to problems with blood pressure and diabetes.   The Body Mass Index  or BMI is a way of measuring how much of your body is fat. Having a BMI above 27 increases the risk of heart disease, diabetes, hypertension, stroke and other problems related to obesity. Your caregiver can help determine your BMI and based on it develop an exercise and dietary program to help you achieve or maintain this important measurement at a healthful level.  High blood pressure causes  heart and blood vessel problems.  Persistent high blood pressure should be treated with medicine if weight loss and exercise do not work.   Fat and cholesterol leaves deposits in your arteries that can block them. This causes heart disease and vessel disease elsewhere in your body.  If your cholesterol is found to be high, or if you have heart disease or certain other medical conditions, then you may need to have your cholesterol monitored frequently and be treated with medication.   Ask if you should have a cardiac stress test if your history suggests this. A stress test is a test done on a treadmill that looks for heart disease. This test can find disease prior to there being a problem.  Osteoporosis is a disease in which the bones lose minerals and strength as we age. This can result in serious bone fractures. Risk of osteoporosis can be identified using a bone density scan. Men ages 74 and over should discuss this with their caregivers. Ask your caregiver whether you should be taking a calcium supplement and Vitamin D, to reduce the rate of osteoporosis.   Avoid drinking alcohol in excess (more than two drinks per day).  Avoid use of street drugs. Do not share needles with anyone. Ask for professional help if you need assistance or instructions on stopping the use of alcohol, cigarettes, and/or drugs.  Brush your teeth twice a day with fluoride toothpaste, and floss once a day. Good oral hygiene prevents tooth decay and gum disease. The problems can be painful, unattractive, and can cause other health problems. Visit your dentist for a routine oral and dental check up and preventive care every 6-12 months.   Look at your skin regularly.  Use a mirror to look at your back. Notify your caregivers of changes in moles, especially if there are changes in shapes, colors, a size larger than a pencil eraser, an irregular border, or development of new moles.  Safety:  Use seatbelts 100% of the time,  whether driving or as a passenger.  Use safety devices such as hearing protection if you work in environments with loud noise or significant background noise.  Use safety glasses when doing any work that could send debris in to the eyes.  Use a helmet if you ride a bike or motorcycle.  Use appropriate safety gear for contact sports.  Talk to your caregiver about gun safety.  Use sunscreen with a SPF (or skin protection factor) of 15 or greater.  Lighter skinned people are at a greater risk of skin cancer. Don't forget to also wear sunglasses in order to protect your eyes from too much damaging sunlight. Damaging sunlight can accelerate cataract formation.   Practice safe sex. Use condoms. Condoms are used for birth control and to help reduce the spread of sexually transmitted infections (or STIs).  Some of the STIs are gonorrhea (the clap), chlamydia, syphilis, trichomonas, herpes, HPV (human papilloma virus) and HIV (human immunodeficiency virus) which causes AIDS. The herpes, HIV and HPV are viral illnesses that have no cure. These can result in disability, cancer and  death.   Keep carbon monoxide and smoke detectors in your home functioning at all times. Change the batteries every 6 months or use a model that plugs into the wall.   Vaccinations:  Stay up to date with your tetanus shots and other required immunizations. You should have a booster for tetanus every 10 years. Be sure to get your flu shot every year, since 5%-20% of the [place] population comes down with the flu. The flu vaccine changes each year, so being vaccinated once is not enough. Get your shot in the fall, before the flu season peaks.   Other vaccines to consider:  Human Papilloma Virus or HPV causes cancer of the cervix, and other infections that can be transmitted from person to person. There is a vaccine for HPV, and males should get immunized between the ages of 39 and 53. It requires a series of 3 shots.   Pneumococcal vaccine  to protect against certain types of pneumonia.  This is normally recommended for adults age 41 or older.  However, adults younger than 46 years old with certain underlying conditions such as diabetes, heart or lung disease should also receive the vaccine.  Shingles vaccine to protect against Varicella Zoster if you are older than age 63, or younger than 46 years old with certain underlying illness.  If you have not had the Shingrix vaccine, please call your insurer to inquire about coverage for the Shingrix vaccine given in 2 doses.   Some insurers cover this vaccine after age 50, some cover this after age 68.  If your insurer covers this, then call to schedule appointment to have this vaccine here  Hepatitis A vaccine to protect against a form of infection of the liver by a virus acquired from food.  Hepatitis B vaccine to protect against a form of infection of the liver by a virus acquired from blood or body fluids, particularly if you work in health care.  If you plan to travel internationally, check with your local health department for specific vaccination recommendations.   What should I know about cancer screening? Many types of cancers can be detected early and may often be prevented. Lung Cancer  You should be screened every year for lung cancer if: ? You are a current smoker who has smoked for at least 30 years. ? You are a former smoker who has quit within the past 15 years.  Talk to your health care provider about your screening options, when you should start screening, and how often you should be screened.  Colorectal Cancer  Routine colorectal cancer screening usually begins at 46 years of age and should be repeated every 5-10 years until you are 46 years old. You may need to be screened more often if early forms of precancerous polyps or small growths are found. Your health care provider may recommend screening at an earlier age if you have risk factors for colon cancer.  Your  health care provider may recommend using home test kits to check for hidden blood in the stool.  A small camera at the end of a tube can be used to examine your colon (sigmoidoscopy or colonoscopy). This checks for the earliest forms of colorectal cancer.  Prostate and Testicular Cancer  Depending on your age and overall health, your health care provider may do certain tests to screen for prostate and testicular cancer.  Talk to your health care provider about any symptoms or concerns you have about testicular or prostate cancer.  Skin  Cancer  Check your skin from head to toe regularly.  Tell your health care provider about any new moles or changes in moles, especially if: ? There is a change in a mole's size, shape, or color. ? You have a mole that is larger than a pencil eraser.  Always use sunscreen. Apply sunscreen liberally and repeat throughout the day.  Protect yourself by wearing long sleeves, pants, a wide-brimmed hat, and sunglasses when outside.

## 2018-08-27 NOTE — Progress Notes (Signed)
Subjective:   HPI  Dean Wallace is a 48 y.o. male who presents for Chief Complaint  Patient presents with  . cpe    fasting cpe  vision early 2019    Medical care team includes: Tysinger, Camelia Eng, PA-C here for primary care Dentist Eye doctor  Concerns: Has not been here since 2017 for diabetes follow-up.  At his last visit he has done a great job in losing weight and getting his diet under control and come off medication for a while.  He has gained some weight in the meantime.  Here for fasting labs, wants STD labs as a new partner 2 years ago  Dad is under treatment for prostate cancer in his 56s currently  Reviewed their medical, surgical, family, social, medication, and allergy history and updated chart as appropriate.  Past Medical History:  Diagnosis Date  . Diabetes mellitus without complication ([place])   . 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 DECORTICATION Right 03/12/2014   Procedure: DECORTICATION;  Surgeon: Melrose Nakayama, MD;  Location: [facility];  Service: Thoracic;  Laterality: Right;  . lung drained    . VIDEO ASSISTED THORACOSCOPY (VATS)/EMPYEMA Right 03/12/2014   Procedure: VIDEO ASSISTED THORACOSCOPY (VATS)/EMPYEMA;  Surgeon: Melrose Nakayama, MD;  Location: [facility] OR;  Service: Thoracic;  Laterality: Right;    Social History   Socioeconomic History  . Marital status: Single    Spouse name: Not on file  . Number of children: Not on file  . Years of education: Not on file  . Highest education level: Not on file  Occupational History  . Not on file  Social Needs  . Financial resource strain: Not on file  . Food insecurity:    Worry: Not on file    Inability: Not on file  . Transportation needs:    Medical: Not on file    Non-medical: Not on file  Tobacco Use  . Smoking status: Never Smoker  . Smokeless tobacco: Never Used  Substance and Sexual Activity  . Alcohol use: No  .  Drug use: No  . Sexual activity: Yes  Lifestyle  . Physical activity:    Days per week: Not on file    Minutes per session: Not on file  . Stress: Not on file  Relationships  . Social connections:    Talks on phone: Not on file    Gets together: Not on file    Attends religious service: Not on file    Active member of club or organization: Not on file    Attends meetings of clubs or organizations: Not on file    Relationship status: Not on file  . Intimate partner violence:    Fear of current or ex partner: Not on file    Emotionally abused: Not on file    Physically abused: Not on file    Forced sexual activity: Not on file  Other Topics Concern  . Not on file  Social History Narrative   Male partner, exercising regularly, running, unemployed currently.  Working for [employer].  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   As of 08/2018    Family History  Problem Relation Age of Onset  . Cancer Father 26       prostate  . Heart disease Father 86       CHF, MI   . Hypertension Father   . COPD Mother   .  Heart disease Mother        CHF  . Hypertension Mother   . Cancer Mother        skin  . Diabetes Brother   . Arthritis Sister   . Cancer Maternal Grandmother        breast     Current Outpatient Medications:  .  ALPRAZolam (XANAX) 0.5 MG tablet, Take 1 tablet (0.5 mg total) by mouth at bedtime as needed for anxiety., Disp: 45 tablet, Rfl: 1  No Known Allergies    Review of Systems Constitutional: -fever, -chills, -sweats, -unexpected weight change, -decreased appetite, -fatigue Allergy: -sneezing, -itching, -congestion Dermatology: -changing moles, --rash, -lumps ENT: -runny nose, -ear pain, -sore throat, -hoarseness, -sinus pain, -teeth pain, - ringing in ears, -hearing loss, -nosebleeds Cardiology: -chest pain, -palpitations, -swelling, -difficulty breathing when lying flat, -waking up short of breath Respiratory: -cough, -shortness of  breath, -difficulty breathing with exercise or exertion, -wheezing, -coughing up blood Gastroenterology: -abdominal pain, -nausea, -vomiting, -diarrhea, -constipation, -blood in stool, -changes in bowel movement, -difficulty swallowing or eating Hematology: -bleeding, -bruising  Musculoskeletal: -joint aches, -muscle aches, -joint swelling, -back pain, -neck pain, -cramping, -changes in gait Ophthalmology: denies vision changes, eye redness, itching, discharge Urology: -burning with urination, -difficulty urinating, -blood in urine, -urinary frequency, -urgency, -incontinence Neurology: -headache, -weakness, -tingling, -numbness, -memory loss, -falls, -dizziness Psychology: -depressed mood, -agitation, -sleep problems Male GU: no testicular mass, pain, no lymph nodes swollen, no swelling, no rash.     Objective:  BP 120/80   Pulse 86   Temp 97.7 F (36.5 C) (Oral)   Resp 16   Ht 5' 5"  (1.651 m)   Wt 204 lb 9.6 oz (92.8 kg)   SpO2 97%   BMI 34.05 kg/m   General appearance: alert, no distress, WD/WN, white male Skin:few scattered macules, no worrisome lesions HEENT: normocephalic, conjunctiva/corneas normal, sclerae anicteric, PERRLA, EOMi, nares patent, no discharge or erythema, pharynx normal Oral cavity: MMM, tongue normal, teeth normal Neck: supple, no lymphadenopathy, no thyromegaly, no masses, normal ROM, no bruits Chest: non tender, normal shape and expansion Heart: RRR, normal S1, S2, no murmurs Lungs: CTA bilaterally, no wheezes, rhonchi, or rales Abdomen: +bs, soft, non tender, non distended, no masses, no hepatomegaly, no splenomegaly, no bruits Back: non tender, normal ROM, no scoliosis Musculoskeletal: upper extremities non tender, no obvious deformity, normal ROM throughout, lower extremities non tender, no obvious deformity, normal ROM throughout Extremities: no edema, no cyanosis, no clubbing Pulses: 2+ symmetric, upper and lower extremities, normal cap  refill Neurological: alert, oriented x 3, CN2-12 intact, strength normal upper extremities and lower extremities, sensation normal throughout, DTRs 2+ throughout, no cerebellar signs, gait normal Psychiatric: normal affect, behavior normal, pleasant  GU: normal male external genitalia, circumcised, nontender, no masses, no hernia, no lymphadenopathy Rectal: deferred    Diabetic Foot Exam - Simple   Simple Foot Form Diabetic Foot exam was performed with the following findings:  Yes 08/27/2018 12:38 PM  Visual Inspection No deformities, no ulcerations, no other skin breakdown bilaterally:  Yes Sensation Testing Intact to touch and monofilament testing bilaterally:  Yes Pulse Check Posterior Tibialis and Dorsalis pulse intact bilaterally:  Yes Comments      Assessment and Plan :   Encounter Diagnoses  Name Primary?  . Routine general medical examination at a health care facility Yes  . Type 2 diabetes mellitus with complication, without long-term current use of insulin ([place])   . Family history of prostate cancer   . Screening  for prostate cancer   . Vaccine counseling   . Influenza vaccination declined   . Screen for colon cancer   . Screen for STD (sexually transmitted disease)     Physical exam - discussed and counseled on healthy lifestyle, diet, exercise, preventative care, vaccinations, sick and well care, proper use of emergency dept and after hours care, and addressed their concerns.    Health screening: See your eye doctor yearly for routine vision care. See your dentist yearly for routine dental care including hygiene visits twice yearly.  Discussed STD testing, discussed prevention, condom use, means of transmission  Cancer screening Advised monthly self testicular exam  Colonoscopy:  Advised he check insurance coverage for colonoscopy  Discussed PSA, prostate exam, and prostate cancer screening risks/benefits.     Vaccinations: Advised yearly influenza  vaccine Patient declines influenza vaccine  Acute issues discussed: none  Separate significant chronic issues discussed: We discussed the need for routine yearly follow-up regardless of how well his diabetes is controlled last time I have not seen him in over a year. He is diet controlled currently.  Counseled on diet and exercise, vaccines, routine follow-up.  Ata was seen today for cpe.  Diagnoses and all orders for this visit:  Routine general medical examination at a health care facility -     POCT Urinalysis DIP (Proadvantage Device) -     Comprehensive metabolic panel -     CBC with Differential/Platelet -     Lipid panel -     PSA -     Hemoglobin A1c -     TSH -     HIV Antibody (routine testing w rflx) -     RPR -     GC/Chlamydia Probe Amp -     Microalbumin / creatinine urine ratio -     HM DIABETES EYE EXAM -     HM DIABETES FOOT EXAM  Type 2 diabetes mellitus with complication, without long-term current use of insulin (HCC) -     Hemoglobin A1c -     Microalbumin / creatinine urine ratio -     HM DIABETES EYE EXAM -     HM DIABETES FOOT EXAM  Family history of prostate cancer  Screening for prostate cancer -     PSA  Vaccine counseling  Influenza vaccination declined  Screen for colon cancer  Screen for STD (sexually transmitted disease) -     HIV Antibody (routine testing w rflx) -     RPR -     GC/Chlamydia Probe Amp   Follow-up pending labs, yearly for physical

## 2018-08-28 LAB — COMPREHENSIVE METABOLIC PANEL
ALK PHOS: 69 IU/L (ref 39–117)
ALT: 24 IU/L (ref 0–44)
AST: 21 IU/L (ref 0–40)
Albumin/Globulin Ratio: 2.6 — ABNORMAL HIGH (ref 1.2–2.2)
Albumin: 4.9 g/dL (ref 3.5–5.5)
BUN/Creatinine Ratio: 15 (ref 9–20)
BUN: 14 mg/dL (ref 6–24)
Bilirubin Total: 0.6 mg/dL (ref 0.0–1.2)
CO2: 24 mmol/L (ref 20–29)
CREATININE: 0.93 mg/dL (ref 0.76–1.27)
Calcium: 9.8 mg/dL (ref 8.7–10.2)
Chloride: 102 mmol/L (ref 96–106)
GFR calc Af Amer: 114 mL/min/{1.73_m2} (ref >59)
GFR calc non Af Amer: 99 mL/min/{1.73_m2} (ref >59)
Globulin, Total: 1.9 g/dL (ref 1.5–4.5)
Glucose: 145 mg/dL — ABNORMAL HIGH (ref 65–99)
Potassium: 4.6 mmol/L (ref 3.5–5.2)
SODIUM: 143 mmol/L (ref 134–144)
Total Protein: 6.8 g/dL (ref 6.0–8.5)

## 2018-08-28 LAB — CBC WITH DIFFERENTIAL/PLATELET
Basophils Absolute: 0.1 10*3/uL (ref 0.0–0.2)
Basos: 1 %
EOS (ABSOLUTE): 0.2 10*3/uL (ref 0.0–0.4)
EOS: 3 %
HEMATOCRIT: 43.3 % (ref 37.5–51.0)
Hemoglobin: 15.2 g/dL (ref 13.0–17.7)
IMMATURE GRANS (ABS): 0 10*3/uL (ref 0.0–0.1)
IMMATURE GRANULOCYTES: 0 %
LYMPHS: 18 %
Lymphocytes Absolute: 1.4 10*3/uL (ref 0.7–3.1)
MCH: 31 pg (ref 26.6–33.0)
MCHC: 35.1 g/dL (ref 31.5–35.7)
MCV: 88 fL (ref 79–97)
MONOCYTES: 9 %
MONOS ABS: 0.7 10*3/uL (ref 0.1–0.9)
NEUTROS PCT: 69 %
Neutrophils Absolute: 5.4 10*3/uL (ref 1.4–7.0)
Platelets: 290 10*3/uL (ref 150–450)
RBC: 4.91 x10E6/uL (ref 4.14–5.80)
RDW: 12 % — AB (ref 12.3–15.4)
WBC: 7.7 10*3/uL (ref 3.4–10.8)

## 2018-08-28 LAB — TSH: TSH: 1.76 u[IU]/mL (ref 0.450–4.500)

## 2018-08-28 LAB — LIPID PANEL
CHOL/HDL RATIO: 3.6 ratio (ref 0.0–5.0)
Cholesterol, Total: 202 mg/dL — ABNORMAL HIGH (ref 100–199)
HDL: 56 mg/dL (ref >39)
LDL Calculated: 133 mg/dL — ABNORMAL HIGH (ref 0–99)
TRIGLYCERIDES: 67 mg/dL (ref 0–149)
VLDL Cholesterol Cal: 13 mg/dL (ref 5–40)

## 2018-08-28 LAB — HEMOGLOBIN A1C
ESTIMATED AVERAGE GLUCOSE: 126 mg/dL
Hgb A1c MFr Bld: 6 % — ABNORMAL HIGH (ref 4.8–5.6)

## 2018-08-28 LAB — MICROALBUMIN / CREATININE URINE RATIO
CREATININE, UR: 94.4 mg/dL
MICROALBUM., U, RANDOM: 23 ug/mL
Microalb/Creat Ratio: 24.4 mg/g creat (ref 0.0–30.0)

## 2018-08-28 LAB — PSA: Prostate Specific Ag, Serum: 1.5 ng/mL (ref 0.0–4.0)

## 2018-08-28 LAB — HIV ANTIBODY (ROUTINE TESTING W REFLEX): HIV Screen 4th Generation wRfx: NONREACTIVE

## 2018-08-28 LAB — RPR: RPR: NONREACTIVE

## 2018-08-30 ENCOUNTER — Other Ambulatory Visit: Payer: Self-pay | Admitting: Medical

## 2018-08-30 MED ORDER — METFORMIN HCL 500 MG PO TABS: 500.0000 mg | ORAL_TABLET | Freq: Every day | ORAL | 1 refills | Status: DC

## 2018-08-30 MED ORDER — ALPRAZOLAM 0.5 MG PO TABS: 0.5000 mg | ORAL_TABLET | Freq: Every evening | ORAL | 1 refills | Status: DC | PRN

## 2018-08-30 MED ORDER — PRAVASTATIN SODIUM 20 MG PO TABS: 20.0000 mg | ORAL_TABLET | Freq: Every evening | ORAL | 3 refills | Status: DC

## 2018-08-31 LAB — GC/CHLAMYDIA PROBE AMP
Chlamydia trachomatis, NAA: NEGATIVE
Neisseria gonorrhoeae by PCR: NEGATIVE

## 2020-06-29 ENCOUNTER — Encounter: Payer: Self-pay | Admitting: Medical

## 2020-06-29 ENCOUNTER — Other Ambulatory Visit: Payer: Self-pay

## 2020-06-29 ENCOUNTER — Ambulatory Visit (INDEPENDENT_AMBULATORY_CARE_PROVIDER_SITE_OTHER): Payer: 59 | Admitting: Medical

## 2020-06-29 VITALS — BP 130/90 | HR 85 | Ht 65.0 in | Wt 207.0 lb

## 2020-06-29 DIAGNOSIS — Z0000 Encounter for general adult medical examination without abnormal findings: Secondary | ICD-10-CM | POA: Diagnosis not present

## 2020-06-29 DIAGNOSIS — Z8249 Family history of ischemic heart disease and other diseases of the circulatory system: Secondary | ICD-10-CM | POA: Diagnosis not present

## 2020-06-29 DIAGNOSIS — Z1159 Encounter for screening for other viral diseases: Secondary | ICD-10-CM

## 2020-06-29 DIAGNOSIS — Z136 Encounter for screening for cardiovascular disorders: Secondary | ICD-10-CM

## 2020-06-29 DIAGNOSIS — Z23 Encounter for immunization: Secondary | ICD-10-CM | POA: Diagnosis not present

## 2020-06-29 DIAGNOSIS — Z1211 Encounter for screening for malignant neoplasm of colon: Secondary | ICD-10-CM

## 2020-06-29 DIAGNOSIS — Z125 Encounter for screening for malignant neoplasm of prostate: Secondary | ICD-10-CM | POA: Insufficient documentation

## 2020-06-29 DIAGNOSIS — E1169 Type 2 diabetes mellitus with other specified complication: Secondary | ICD-10-CM | POA: Diagnosis not present

## 2020-06-29 DIAGNOSIS — Z7189 Other specified counseling: Secondary | ICD-10-CM | POA: Diagnosis not present

## 2020-06-29 DIAGNOSIS — Z8709 Personal history of other diseases of the respiratory system: Secondary | ICD-10-CM

## 2020-06-29 DIAGNOSIS — R030 Elevated blood-pressure reading, without diagnosis of hypertension: Secondary | ICD-10-CM

## 2020-06-29 DIAGNOSIS — Z8042 Family history of malignant neoplasm of prostate: Secondary | ICD-10-CM | POA: Diagnosis not present

## 2020-06-29 DIAGNOSIS — Z7185 Encounter for immunization safety counseling: Secondary | ICD-10-CM

## 2020-06-29 DIAGNOSIS — Z113 Encounter for screening for infections with a predominantly sexual mode of transmission: Secondary | ICD-10-CM

## 2020-06-29 DIAGNOSIS — Z299 Encounter for prophylactic measures, unspecified: Secondary | ICD-10-CM

## 2020-06-29 LAB — POCT URINALYSIS DIP (PROADVANTAGE DEVICE)
Bilirubin, UA: NEGATIVE
Blood, UA: NEGATIVE
Glucose, UA: 500 mg/dL — AB
Ketones, POC UA: NEGATIVE mg/dL
Leukocytes, UA: NEGATIVE
Nitrite, UA: NEGATIVE
Protein Ur, POC: NEGATIVE mg/dL
Specific Gravity, Urine: 1.025
Urobilinogen, Ur: NEGATIVE
pH, UA: 6 (ref 5.0–8.0)

## 2020-06-29 NOTE — Progress Notes (Signed)
Subjective:   HPI  Dean Wallace is a 49 y.o. male who presents for Chief Complaint  Patient presents with  . Annual Exam    with fasting labs   . Depression    screening is a 8   . Cough    Patient Care Team: Kaede Clendenen, Camelia Eng, PA-C as PCP - General (Family Medicine) Bernie Covey, RN (Inactive) as Registered Nurse Sees dentist Sees eye doctor  Concerns: History of diabetes.  Not checking blood sugars he has been diet controlled last few years.  He is currently not exercising and slipped off the wagon a bit with exercise/diet.  He wants full STD testing.  His found out recently that his partner of 4  years has been seeing other partners and been promiscuous  Reviewed their medical, surgical, family, social, medication, and allergy history and updated chart as appropriate.  Past Medical History:  Diagnosis Date  . Diabetes mellitus without complication ([place])    1478 out of control sugars, diet controlled 2016-2020  . 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 Family history of premature CAD   . Family history of prostate cancer   . Hyperlipidemia     Past Surgical History:  Procedure Laterality Date  . DECORTICATION Right 03/12/2014   Procedure: DECORTICATION;  Surgeon: Melrose Nakayama, MD;  Location: [facility];  Service: Thoracic;  Laterality: Right;  . lung drained    . VIDEO ASSISTED THORACOSCOPY (VATS)/EMPYEMA Right 03/12/2014   Procedure: VIDEO ASSISTED THORACOSCOPY (VATS)/EMPYEMA;  Surgeon: Melrose Nakayama, MD;  Location: [facility];  Service: Thoracic;  Laterality: Right;      Family History  Problem Relation Age of Onset  . Cancer Father 65       prostate  . Heart disease Father 70       CHF, MI   . Hypertension Father   . COPD Mother   . Heart disease Mother        CHF  . Hypertension Mother   . Cancer Mother        skin  . Diabetes Brother   . Arthritis Sister   . Cancer Maternal Grandmother        breast     Current Outpatient  Medications:  .  pravastatin (PRAVACHOL) 20 MG tablet, Take 1 tablet (20 mg total) by mouth every evening., Disp: 90 tablet, Rfl: 3  No Known Allergies   Review of Systems Constitutional: -fever, -chills, -sweats, -unexpected weight change, -decreased appetite, -fatigue Allergy: -sneezing, -itching, -congestion Dermatology: -changing moles, --rash, -lumps ENT: -runny nose, -ear pain, -sore throat, -hoarseness, -sinus pain, -teeth pain, - ringing in ears, -hearing loss, -nosebleeds Cardiology: -chest pain, -palpitations, -swelling, -difficulty breathing when lying flat, -waking up short of breath Respiratory: -cough, -shortness of breath, -difficulty breathing with exercise or exertion, -wheezing, -coughing up blood Gastroenterology: -abdominal pain, -nausea, -vomiting, -diarrhea, -constipation, -blood in stool, -changes in bowel movement, -difficulty swallowing or eating Hematology: -bleeding, -bruising  Musculoskeletal: -joint aches, -muscle aches, -joint swelling, -back pain, -neck pain, -cramping, -changes in gait Ophthalmology: denies vision changes, eye redness, itching, discharge Urology: -burning with urination, -difficulty urinating, -blood in urine, -urinary frequency, -urgency, -incontinence Neurology: -headache, -weakness, -tingling, -numbness, -memory loss, -falls, -dizziness Psychology: -depressed mood, -agitation, -sleep problems Male GU: no testicular mass, pain, no lymph nodes swollen, no swelling, no rash.     Objective:  BP 130/90   Pulse 85   Ht 5' 5"  (1.651 m)   Wt 207 lb (93.9  kg)   SpO2 96%   BMI 34.45 kg/m   General appearance: alert, no distress, WD/WN, Caucasian male Skin: Scattered macules, no worrisome lesions, small 2 mm raised pearly lesion on right nare that he says is unchanged for years, similar pearly 3 mm raised papular lesion on right forehead above the eyebrow unchanged for years Neck: supple, no lymphadenopathy, no thyromegaly, no masses, normal  ROM, no bruits Chest: non tender, normal shape and expansion Heart: RRR, normal S1, S2, no murmurs Lungs: CTA bilaterally, no wheezes, rhonchi, or rales Abdomen: +bs, soft, non tender, non distended, no masses, no hepatomegaly, no splenomegaly, no bruits Back: non tender, normal ROM, no scoliosis Musculoskeletal: upper extremities non tender, no obvious deformity, normal ROM throughout, lower extremities non tender, no obvious deformity, normal ROM throughout Extremities: no edema, no cyanosis, no clubbing Pulses: 2+ symmetric, upper and lower extremities, normal cap refill Neurological: alert, oriented x 3, CN2-12 intact, strength normal upper extremities and lower extremities, sensation normal throughout, DTRs 2+ throughout, no cerebellar signs, gait normal Psychiatric: normal affect, behavior normal, pleasant  GU: normal male external genitalia,circumcised, nontender, no masses, no hernia, no lymphadenopathy Rectal: Anus normal tone, prostate within normal limits   EKG indication physical, family history of heart disease, diabetes, rate 73 bpm, PR 176 ms, QRS 84 ms, QTC 438 ms, Axis IV degrees, normal sinus rhythm, no acute change   Assessment and Plan :   Encounter Diagnoses  Name Primary?  . Routine general medical examination at a health care facility Yes  . Family history of prostate cancer   . Screen for STD (sexually transmitted disease)   . Vaccine counseling   . Type 2 diabetes mellitus with other specified complication, without long-term current use of insulin ([place])   . Screening for prostate cancer   . Screen for colon cancer   . Screening for heart disease   . Family history of premature CAD   . Need for hepatitis C screening test   . Need for hepatitis B screening test   . History of empyema of pleura   . Need for pneumococcal vaccination   . Prophylactic measure   . Elevated blood-pressure reading without diagnosis of hypertension     Physical exam - discussed  and counseled on healthy lifestyle, diet, exercise, preventative care, vaccinations, sick and well care, proper use of emergency dept and after hours care, and addressed their concerns.    Health screening: See your eye doctor yearly for routine vision care. See your dentist yearly for routine dental care including hygiene visits twice yearly.  Discussed STD testing, discussed prevention, condom use, means of transmission  Cancer screening Colonoscopy:  Pending labs he will consider colonoscopy versus Cologuard  Discussed PSA, prostate exam, and prostate cancer screening risks/benefits.  He requested PSA screening given father's history of prostate cancer   Vaccinations: Advised yearly influenza vaccine He is up-to-date on tetanus, Covid and pneumococcal 23 vaccine  Counseled on the pneumococcal vaccine.  Vaccine information sheet given.  Pneumococcal vaccine Prevnar 13 given after consent obtained.   Separate significant issues discussed: Diabetes-diet controlled last several years.  Routine labs today.  I did advise he needs to come in more than periodic.  He did not come in last year for physical.  We discussed importance of routine glucometer testing, healthy diet and exercise  Dyslipidemia-advise he stay on statin ongoing to reduce risk of heart disease given history of diabetes.  He has not been compliant with this  Elevated blood  pressure today, discussed we may need to add ARB or ACE inhibitor to reduce kidney risk as well as lower blood pressure  Prophylactic measure-he wants to begin prep Truvada or Descovy.  Discussed risk and benefits of medication, proper use of medication, routine STD testing.  Avrohom was seen today for annual exam, depression and cough.  Diagnoses and all orders for this visit:  Routine general medical examination at a health care facility -     Comprehensive metabolic panel -     CBC with Differential/Platelet -     Lipid panel -     Hemoglobin  A1c -     Microalbumin/Creatinine Ratio, Urine -     Hepatitis C antibody -     HIV Antibody (routine testing w rflx) -     RPR -     GC/Chlamydia Probe Amp -     Hepatitis B surface antibody,quantitative -     Hepatitis B surface antigen -     EKG 12-Lead -     PSA -     POCT Urinalysis DIP (Proadvantage Device) -     Hepatitis B core antibody, IgM  Family history of prostate cancer  Screen for STD (sexually transmitted disease) -     Hepatitis C antibody -     HIV Antibody (routine testing w rflx) -     RPR -     GC/Chlamydia Probe Amp -     Hepatitis B surface antibody,quantitative -     Hepatitis B surface antigen -     Hepatitis B core antibody, IgM  Vaccine counseling  Type 2 diabetes mellitus with other specified complication, without long-term current use of insulin (HCC) -     POCT Urinalysis DIP (Proadvantage Device)  Screening for prostate cancer -     PSA  Screen for colon cancer  Screening for heart disease -     EKG 12-Lead  Family history of premature CAD -     EKG 12-Lead  Need for hepatitis C screening test -     Hepatitis C antibody  Need for hepatitis B screening test -     Hepatitis B surface antibody,quantitative -     Hepatitis B surface antigen  History of empyema of pleura  Need for pneumococcal vaccination  Prophylactic measure  Elevated blood-pressure reading without diagnosis of hypertension  Other orders -     Pneumococcal conjugate vaccine 13-valent    Follow-up pending labs, yearly for physical

## 2020-06-30 ENCOUNTER — Other Ambulatory Visit: Payer: Self-pay | Admitting: Medical

## 2020-06-30 LAB — COMPREHENSIVE METABOLIC PANEL
ALT: 44 IU/L (ref 0–44)
AST: 21 IU/L (ref 0–40)
Albumin/Globulin Ratio: 1.8 (ref 1.2–2.2)
Albumin: 4.4 g/dL (ref 4.0–5.0)
Alkaline Phosphatase: 96 IU/L (ref 48–121)
BUN/Creatinine Ratio: 19 (ref 9–20)
BUN: 16 mg/dL (ref 6–24)
Bilirubin Total: 0.4 mg/dL (ref 0.0–1.2)
CO2: 23 mmol/L (ref 20–29)
Calcium: 9.8 mg/dL (ref 8.7–10.2)
Chloride: 102 mmol/L (ref 96–106)
Creatinine, Ser: 0.85 mg/dL (ref 0.76–1.27)
GFR calc Af Amer: 120 mL/min/{1.73_m2} (ref >59)
GFR calc non Af Amer: 104 mL/min/{1.73_m2} (ref >59)
Globulin, Total: 2.5 g/dL (ref 1.5–4.5)
Glucose: 270 mg/dL — ABNORMAL HIGH (ref 65–99)
Potassium: 4.7 mmol/L (ref 3.5–5.2)
Sodium: 138 mmol/L (ref 134–144)
Total Protein: 6.9 g/dL (ref 6.0–8.5)

## 2020-06-30 LAB — CBC WITH DIFFERENTIAL/PLATELET
Basophils Absolute: 0.1 10*3/uL (ref 0.0–0.2)
Basos: 1 %
EOS (ABSOLUTE): 0.1 10*3/uL (ref 0.0–0.4)
Eos: 1 %
Hematocrit: 49.3 % (ref 37.5–51.0)
Hemoglobin: 16.2 g/dL (ref 13.0–17.7)
Immature Grans (Abs): 0.1 10*3/uL (ref 0.0–0.1)
Immature Granulocytes: 1 %
Lymphocytes Absolute: 1.5 10*3/uL (ref 0.7–3.1)
Lymphs: 17 %
MCH: 30.2 pg (ref 26.6–33.0)
MCHC: 32.9 g/dL (ref 31.5–35.7)
MCV: 92 fL (ref 79–97)
Monocytes Absolute: 0.7 10*3/uL (ref 0.1–0.9)
Monocytes: 8 %
Neutrophils Absolute: 6.4 10*3/uL (ref 1.4–7.0)
Neutrophils: 72 %
Platelets: 302 10*3/uL (ref 150–450)
RBC: 5.36 x10E6/uL (ref 4.14–5.80)
RDW: 12.5 % (ref 11.6–15.4)
WBC: 8.8 10*3/uL (ref 3.4–10.8)

## 2020-06-30 LAB — HEPATITIS B SURFACE ANTIBODY, QUANTITATIVE: Hepatitis B Surf Ab Quant: <3.1 m[IU]/mL — ABNORMAL LOW (ref >9.9)

## 2020-06-30 LAB — LIPID PANEL
Chol/HDL Ratio: 4.9 ratio (ref 0.0–5.0)
Cholesterol, Total: 212 mg/dL — ABNORMAL HIGH (ref 100–199)
HDL: 43 mg/dL (ref >39)
LDL Chol Calc (NIH): 149 mg/dL — ABNORMAL HIGH (ref 0–99)
Triglycerides: 108 mg/dL (ref 0–149)
VLDL Cholesterol Cal: 20 mg/dL (ref 5–40)

## 2020-06-30 LAB — RPR: RPR Ser Ql: NONREACTIVE

## 2020-06-30 LAB — HEPATITIS B CORE ANTIBODY, IGM: Hep B C IgM: NEGATIVE

## 2020-06-30 LAB — HIV ANTIBODY (ROUTINE TESTING W REFLEX): HIV Screen 4th Generation wRfx: NONREACTIVE

## 2020-06-30 LAB — PSA: Prostate Specific Ag, Serum: 1.1 ng/mL (ref 0.0–4.0)

## 2020-06-30 LAB — HEMOGLOBIN A1C
Est. average glucose Bld gHb Est-mCnc: 278 mg/dL
Hgb A1c MFr Bld: 11.3 % — ABNORMAL HIGH (ref 4.8–5.6)

## 2020-06-30 LAB — MICROALBUMIN / CREATININE URINE RATIO
Creatinine, Urine: 151.1 mg/dL
Microalb/Creat Ratio: 7 mg/g creat (ref 0–29)
Microalbumin, Urine: 10.7 ug/mL

## 2020-06-30 LAB — HEPATITIS C ANTIBODY: Hep C Virus Ab: <0.1 s/co ratio (ref 0.0–0.9)

## 2020-06-30 LAB — HEPATITIS B SURFACE ANTIGEN: Hepatitis B Surface Ag: NEGATIVE

## 2020-06-30 MED ORDER — EMTRICITABINE-TENOFOVIR DF 200-300 MG PO TABS: 1.0000 | ORAL_TABLET | Freq: Every day | ORAL | 0 refills | Status: DC

## 2020-06-30 MED ORDER — PRAVASTATIN SODIUM 20 MG PO TABS: 20.0000 mg | ORAL_TABLET | Freq: Every evening | ORAL | 3 refills | Status: DC

## 2020-06-30 MED ORDER — METFORMIN HCL 500 MG PO TABS: 500.0000 mg | ORAL_TABLET | Freq: Two times a day (BID) | ORAL | 1 refills | Status: DC

## 2020-07-03 LAB — GC/CHLAMYDIA PROBE AMP
Chlamydia trachomatis, NAA: NEGATIVE
Neisseria Gonorrhoeae by PCR: NEGATIVE

## 2020-07-25 ENCOUNTER — Other Ambulatory Visit: Payer: Self-pay

## 2020-07-25 MED ORDER — PRAVASTATIN SODIUM 20 MG PO TABS: 20.0000 mg | ORAL_TABLET | Freq: Every evening | ORAL | 3 refills | Status: DC

## 2020-07-25 MED ORDER — METFORMIN HCL 500 MG PO TABS: 500.0000 mg | ORAL_TABLET | Freq: Two times a day (BID) | ORAL | 1 refills | Status: DC

## 2020-07-30 ENCOUNTER — Ambulatory Visit: Payer: 59 | Admitting: Medical

## 2020-08-03 LAB — HM DIABETES EYE EXAM

## 2020-08-21 ENCOUNTER — Telehealth: Payer: Self-pay | Admitting: Medical

## 2020-08-21 NOTE — Telephone Encounter (Signed)
Recv'd fax from [facility] that pt needs refills Metformin, Pravastatin and emtric/tenofo tab Called pt left message

## 2020-08-21 NOTE — Telephone Encounter (Signed)
Pt called back now uses [facility] because insurance changed.  Please send in refills to [facility] for Metformin, Truvada, and Pravastatin

## 2020-08-22 ENCOUNTER — Other Ambulatory Visit: Payer: Self-pay | Admitting: Medical

## 2020-08-22 MED ORDER — EMTRICITABINE-TENOFOVIR DF 200-300 MG PO TABS: 1.0000 | ORAL_TABLET | Freq: Every day | ORAL | 0 refills | Status: DC

## 2020-08-22 MED ORDER — METFORMIN HCL 500 MG PO TABS: 500.0000 mg | ORAL_TABLET | Freq: Two times a day (BID) | ORAL | 0 refills | Status: DC

## 2020-08-22 MED ORDER — PRAVASTATIN SODIUM 20 MG PO TABS: 20.0000 mg | ORAL_TABLET | Freq: Every evening | ORAL | 3 refills | Status: DC

## 2020-08-22 NOTE — Telephone Encounter (Signed)
Done, make sure we have fasting follow-up 3 months from last visit

## 2020-08-23 NOTE — Telephone Encounter (Signed)
Pt has follow up appt in 9/21

## 2020-08-27 ENCOUNTER — Other Ambulatory Visit: Payer: Self-pay | Admitting: Medical

## 2020-09-07 ENCOUNTER — Other Ambulatory Visit: Payer: Self-pay

## 2020-09-07 ENCOUNTER — Encounter: Payer: Self-pay | Admitting: Medical

## 2020-09-07 ENCOUNTER — Ambulatory Visit (INDEPENDENT_AMBULATORY_CARE_PROVIDER_SITE_OTHER): Payer: 59 | Admitting: Medical

## 2020-09-07 VITALS — BP 136/80 | HR 94 | Ht 65.0 in | Wt 199.8 lb

## 2020-09-07 DIAGNOSIS — Z23 Encounter for immunization: Secondary | ICD-10-CM | POA: Diagnosis not present

## 2020-09-07 DIAGNOSIS — Z299 Encounter for prophylactic measures, unspecified: Secondary | ICD-10-CM

## 2020-09-07 DIAGNOSIS — Z8249 Family history of ischemic heart disease and other diseases of the circulatory system: Secondary | ICD-10-CM | POA: Diagnosis not present

## 2020-09-07 DIAGNOSIS — Z7185 Encounter for immunization safety counseling: Secondary | ICD-10-CM

## 2020-09-07 DIAGNOSIS — R030 Elevated blood-pressure reading, without diagnosis of hypertension: Secondary | ICD-10-CM

## 2020-09-07 DIAGNOSIS — E1169 Type 2 diabetes mellitus with other specified complication: Secondary | ICD-10-CM | POA: Diagnosis not present

## 2020-09-07 DIAGNOSIS — Z7189 Other specified counseling: Secondary | ICD-10-CM

## 2020-09-07 DIAGNOSIS — Z79899 Other long term (current) drug therapy: Secondary | ICD-10-CM

## 2020-09-07 DIAGNOSIS — Z6833 Body mass index (BMI) 33.0-33.9, adult: Secondary | ICD-10-CM

## 2020-09-07 DIAGNOSIS — Z113 Encounter for screening for infections with a predominantly sexual mode of transmission: Secondary | ICD-10-CM

## 2020-09-07 DIAGNOSIS — E785 Hyperlipidemia, unspecified: Secondary | ICD-10-CM

## 2020-09-07 MED ORDER — FREESTYLE LIBRE SENSOR SYSTEM MISC: 1.0000 | Freq: Every day | 11 refills | Status: DC

## 2020-09-07 NOTE — Assessment & Plan Note (Signed)
Diabetes-from last visit in July he is back on Metformin doing okay on this.  I wrote prescriptions for regular glucometer as well as the freestyle libre system today if insurance covers this.  Discussed glucose monitoring, continue efforts with healthy diet and exercise.  Referral to Pharmquest drug study regarding diabetes and weight loss.

## 2020-09-07 NOTE — Assessment & Plan Note (Signed)
Compliant with Truvada prep.  HIV test today.  Doing well with medication without complaint.  Continue condom use, safe sex.

## 2020-09-07 NOTE — Assessment & Plan Note (Signed)
He is currently back on Pravachol, statin.  Prior to July he had went a time without medication in had not come in for follow-up.  Doing okay on this medicine.

## 2020-09-07 NOTE — Progress Notes (Signed)
Subjective:  Dean Wallace is a 49 y.o. male who presents for Chief Complaint  Patient presents with  . Follow-up    cholesterol and diabetes      Here for med check on diabetes, cholesterol.  Diabetes type 2-compliant with Metformin 500 mg twice daily.  Doesn't have glucometer currently. Exercise and eating habits doing well, improving.  No foot concerns.  No polyuria or polydipsia.    Hyperlipidemia-compliant with Pravachol 10 mg daily without complaint  Prophylaxis-compliant with Truvada prep.  No new sexual partners since last visit.  He has been working on [employer] and exercise  No other aggravating or relieving factors.    No other c/o.  Past Medical History:  Diagnosis Date  . Diabetes mellitus without complication ([place])    9470 out of control sugars, diet controlled 2016-2020  . 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 Family history of premature CAD   . Family history of prostate cancer   . Hyperlipidemia     The following portions of the patient's history were reviewed and updated as appropriate: allergies, current medications, past family history, past medical history, past social history, past surgical history and problem list.  ROS Otherwise as in subjective above  Objective: BP 136/80   Pulse 94   Ht 5' 5"  (1.651 m)   Wt 199 lb 12.8 oz (90.6 kg)   SpO2 95%   BMI 33.25 kg/m   BP Readings from Last 3 Encounters:  09/07/20 136/80  06/29/20 130/90  08/27/18 120/80   Wt Readings from Last 3 Encounters:  09/07/20 199 lb 12.8 oz (90.6 kg)  06/29/20 207 lb (93.9 kg)  08/27/18 204 lb 9.6 oz (92.8 kg)    General appearance: alert, no distress, well developed, well nourished Neck: supple, no lymphadenopathy, no thyromegaly, no masses Heart: RRR, normal S1, S2, no murmurs Lungs: CTA bilaterally, no wheezes, rhonchi, or rales Pulses: 2+ radial pulses, 2+ pedal pulses, normal cap refill Ext: no edema   Assessment: Encounter Diagnoses  Name  Primary?  . Type 2 diabetes mellitus with other specified complication, without long-term current use of insulin ([place]) Yes  . Family history of premature CAD   . Elevated blood-pressure reading without diagnosis of hypertension   . Hyperlipidemia, unspecified hyperlipidemia type   . Vaccine counseling   . Need for influenza vaccination   . Need for hepatitis B vaccination   . Screen for STD (sexually transmitted disease)   . High risk medication use   . Prophylactic measure   . BMI 33.0-33.9,adult      Plan:  Problem List Items Addressed This Visit    DM (diabetes mellitus) ([place]) - Primary    Diabetes-from last visit in July he is back on Metformin doing okay on this.  I wrote prescriptions for regular glucometer as well as the freestyle libre system today if insurance covers this.  Discussed glucose monitoring, continue efforts with healthy diet and exercise.  Referral to Pharmquest drug study regarding diabetes and weight loss.      Relevant Orders   Hemoglobin A1c   Screen for STD (sexually transmitted disease)   Relevant Orders   HIV Antibody (routine testing w rflx)   Vaccine counseling   Family history of premature CAD   Prophylactic measure    Compliant with Truvada prep.  HIV test today.  Doing well with medication without complaint.  Continue condom use, safe sex.      Elevated blood-pressure reading without diagnosis of hypertension  Hyperlipidemia    He is currently back on Pravachol, statin.  Prior to July he had went a time without medication in had not come in for follow-up.  Doing okay on this medicine.        Need for influenza vaccination   Need for hepatitis B vaccination   High risk medication use   Relevant Orders   HIV Antibody (routine testing w rflx)   BMI 33.0-33.9,adult      Prophylactic measure, high risk medication use, screen for STD-HIV lab today, continue Truvada   BMI Metric Follow Up - 09/07/20 0848      BMI Metric Follow Up-Please  document annually   BMI Metric Follow Up Nutrition counseling;Exercise counseling;Pharmacological interventions   referral to drug study for diabetes/weight loss         Vaccines: Counseled on the influenza virus vaccine.  Vaccine information sheet given.  Influenza vaccine given after consent obtained.  Counseled on the Hepatitis B virus/Hepsilav B vaccine.  Vaccine information sheet given. Hepsilav B given after consent, plan for 2nd dose in a month.    Dean Wallace was seen today for follow-up.  Diagnoses and all orders for this visit:  Type 2 diabetes mellitus with other specified complication, without long-term current use of insulin (HCC) -     Hemoglobin A1c  Family history of premature CAD  Elevated blood-pressure reading without diagnosis of hypertension  Hyperlipidemia, unspecified hyperlipidemia type  Vaccine counseling  Need for influenza vaccination  Need for hepatitis B vaccination  Screen for STD (sexually transmitted disease) -     HIV Antibody (routine testing w rflx)  High risk medication use -     HIV Antibody (routine testing w rflx)  Prophylactic measure  BMI 33.0-33.9,adult  Other orders -     Continuous Blood Gluc Sensor ([place]) MISC; 1 each by Does not apply route daily. -     Heplisav-B (HepB-CPG) Vaccine -     Flu Vaccine QUAD 6+ mos PF IM (Fluarix Quad PF)    Follow up: pending labs

## 2020-09-08 ENCOUNTER — Other Ambulatory Visit: Payer: Self-pay | Admitting: Medical

## 2020-09-08 LAB — HEMOGLOBIN A1C
Est. average glucose Bld gHb Est-mCnc: 177 mg/dL
Hgb A1c MFr Bld: 7.8 % — ABNORMAL HIGH (ref 4.8–5.6)

## 2020-09-08 LAB — HIV ANTIBODY (ROUTINE TESTING W REFLEX): HIV Screen 4th Generation wRfx: NONREACTIVE

## 2020-09-17 ENCOUNTER — Telehealth: Payer: Self-pay | Admitting: Medical

## 2020-09-17 NOTE — Telephone Encounter (Signed)
Can you write on a hard script and I can fax as a verbal can not be given for this?

## 2020-09-17 NOTE — Telephone Encounter (Signed)
Pt called and states that he was given written rx's for meter and testing supplies. Those written rx's were lost. Please call what was written to [facility] on [address] in [place]. Pt can be reached at [phone]

## 2020-09-19 ENCOUNTER — Telehealth: Payer: Self-pay

## 2020-09-19 NOTE — Telephone Encounter (Signed)
Done. Inquired about pharmquest via mychart.

## 2020-09-19 NOTE — Telephone Encounter (Signed)
Script written for regular glucometer.  Was freestyle libre not covered?  See if he heard back about Pharmquest study  Audelia Acton

## 2020-09-19 NOTE — Telephone Encounter (Signed)
Pt. Called stating that he saw you back on 09/07/20 and you were suppose to call in a standard glucose meter, lancets, and test strips and also the free style libra I told him the free style was sent in to his mail order. He said if you could send in the regular glucose meter to his local pharmacy [facility] in [place] on [address].

## 2020-09-27 ENCOUNTER — Other Ambulatory Visit: Payer: Self-pay | Admitting: Medical

## 2020-10-09 ENCOUNTER — Other Ambulatory Visit (INDEPENDENT_AMBULATORY_CARE_PROVIDER_SITE_OTHER): Payer: 59

## 2020-10-09 ENCOUNTER — Other Ambulatory Visit: Payer: Self-pay

## 2020-10-09 DIAGNOSIS — Z23 Encounter for immunization: Secondary | ICD-10-CM

## 2020-10-28 ENCOUNTER — Other Ambulatory Visit: Payer: Self-pay | Admitting: Medical

## 2020-10-29 ENCOUNTER — Telehealth: Payer: Self-pay

## 2020-10-29 ENCOUNTER — Other Ambulatory Visit: Payer: Self-pay

## 2020-10-29 NOTE — Telephone Encounter (Signed)
Refill responded to electronically.

## 2020-10-29 NOTE — Telephone Encounter (Signed)
Received a fax from [facility] for a refill on the pts. Metformin last apt 09/07/20 and next apt 12/11/20.

## 2020-11-14 ENCOUNTER — Other Ambulatory Visit: Payer: Self-pay | Admitting: Medical

## 2020-12-11 ENCOUNTER — Other Ambulatory Visit: Payer: Self-pay

## 2020-12-11 ENCOUNTER — Telehealth (INDEPENDENT_AMBULATORY_CARE_PROVIDER_SITE_OTHER): Payer: 59 | Admitting: Medical

## 2020-12-11 ENCOUNTER — Encounter: Payer: Self-pay | Admitting: Medical

## 2020-12-11 VITALS — Wt 194.8 lb

## 2020-12-11 DIAGNOSIS — Z7185 Encounter for immunization safety counseling: Secondary | ICD-10-CM

## 2020-12-11 DIAGNOSIS — Z113 Encounter for screening for infections with a predominantly sexual mode of transmission: Secondary | ICD-10-CM | POA: Diagnosis not present

## 2020-12-11 DIAGNOSIS — Z299 Encounter for prophylactic measures, unspecified: Secondary | ICD-10-CM

## 2020-12-11 DIAGNOSIS — E785 Hyperlipidemia, unspecified: Secondary | ICD-10-CM

## 2020-12-11 DIAGNOSIS — E1169 Type 2 diabetes mellitus with other specified complication: Secondary | ICD-10-CM | POA: Diagnosis not present

## 2020-12-11 DIAGNOSIS — Z1211 Encounter for screening for malignant neoplasm of colon: Secondary | ICD-10-CM

## 2020-12-11 DIAGNOSIS — Z79899 Other long term (current) drug therapy: Secondary | ICD-10-CM

## 2020-12-11 NOTE — Progress Notes (Signed)
Subjective:     Patient ID: Dean Wallace, male   DOB: 12-26-1970, 49 y.o.   MRN: [id number]  This visit type was conducted due to national recommendations for restrictions regarding the COVID-19 Pandemic (e.g. social distancing) in an effort to limit this patient's exposure and mitigate transmission in our community.  Due to their co-morbid illnesses, this patient is at least at moderate risk for complications without adequate follow up.  This format is felt to be most appropriate for this patient at this time.    Documentation for virtual audio and video telecommunications through [place] encounter:  The patient was located at home. The provider was located in the office. The patient did consent to this visit and is aware of possible charges through their insurance for this visit.  The other persons participating in this telemedicine service were none. Time spent on call was 25 minutes and in review of previous records 32 minutes total.  This virtual service is not related to other E/M service within previous 7 days.   HPI Chief Complaint  Patient presents with  . medcheck    Medcheck-    Virtual consult for med check.    Diabetes - Sugars have been so so.  Not too low but not too high.  Could be better.  Fasting 120-140.     hyperlipidemia - compliant with Pravachol without compliant.  holiday eating has been a little bad, but overall has been good since last visit.  Exercising.   Not as consistent as month ago but working on this.     Saw eye doctor in May.  Sees Darryll Capers.     He continues with Truvada prep for prophylactics.  No new partners or exposures.  No complaint with medication.  Compliant.  No new problems.     Review of Systems As in subjective    Objective:   Physical Exam Due to coronavirus pandemic stay at home measures, patient visit was virtual and they were not examined in person.   Wt 194 lb 12.8 oz (88.4 kg)   BMI 32.42 kg/m  General:  Well-developed well-nourished no acute distress, appears well     Assessment:     Encounter Diagnoses  Name Primary?  . Type 2 diabetes mellitus with other specified complication, without long-term current use of insulin ([place]) Yes  . Hyperlipidemia associated with type 2 diabetes mellitus ([place])   . Prophylactic measure   . Screen for colon cancer   . Screen for STD (sexually transmitted disease)   . Vaccine counseling   . High risk medication use        Plan:     Diabetes type 2-home readings look good, he will come in for fasting labs today.  Continue Metformin 500 twice daily, glucometer testing, healthy diet and exercise, and daily foot checks  Hyperlipidemia-updated fasting lipid panel since he has been back on statin  He will come in for HIV test.  He continues on Truvada prep without complaint.  No new exposures or partners.  We discussed the new updated guidelines for screening for colon cancer at 45 years and older.  He will check with insurance on this  We reviewed vaccines.  He came in September and October to do the [facility] vaccine series  We will request his last eye doctor report from May 2021  Need to update foot exam next visit.  Michaeljoseph was seen today for medcheck.  Diagnoses and all orders for this visit:  Type 2  diabetes mellitus with other specified complication, without long-term current use of insulin (HCC) -     Hemoglobin A1c; Future  Hyperlipidemia associated with type 2 diabetes mellitus ([place]) -     Lipid panel; Future -     ALT; Future  Prophylactic measure -     HIV Antibody (routine testing w rflx); Future  Screen for colon cancer  Screen for STD (sexually transmitted disease) -     HIV Antibody (routine testing w rflx); Future  Vaccine counseling  High risk medication use -     ALT; Future -     HIV Antibody (routine testing w rflx); Future  Follow-up today for fasting labs

## 2020-12-12 ENCOUNTER — Other Ambulatory Visit: Payer: Self-pay | Admitting: Medical

## 2020-12-12 ENCOUNTER — Telehealth: Payer: Self-pay | Admitting: Medical

## 2020-12-12 LAB — HEMOGLOBIN A1C
Est. average glucose Bld gHb Est-mCnc: 148 mg/dL
Hgb A1c MFr Bld: 6.8 % — ABNORMAL HIGH (ref 4.8–5.6)

## 2020-12-12 LAB — ALT: ALT: 31 IU/L (ref 0–44)

## 2020-12-12 LAB — LIPID PANEL
Chol/HDL Ratio: 3.5 ratio (ref 0.0–5.0)
Cholesterol, Total: 159 mg/dL (ref 100–199)
HDL: 45 mg/dL (ref >39)
LDL Chol Calc (NIH): 101 mg/dL — ABNORMAL HIGH (ref 0–99)
Triglycerides: 65 mg/dL (ref 0–149)
VLDL Cholesterol Cal: 13 mg/dL (ref 5–40)

## 2020-12-12 LAB — HIV ANTIBODY (ROUTINE TESTING W REFLEX): HIV Screen 4th Generation wRfx: NONREACTIVE

## 2020-12-12 NOTE — Telephone Encounter (Signed)
Records release faxed to [facility] for last OV & diabetic eye exam

## 2021-01-25 ENCOUNTER — Telehealth: Payer: Self-pay | Admitting: Medical

## 2021-01-25 NOTE — Telephone Encounter (Signed)
[facility]ear records received

## 2021-01-31 ENCOUNTER — Other Ambulatory Visit: Payer: Self-pay | Admitting: Medical

## 2021-02-01 ENCOUNTER — Encounter: Payer: Self-pay | Admitting: Medical

## 2021-02-16 LAB — LIPID PANEL
Cholesterol: 172 (ref 0–200)
HDL: 47 (ref 35–70)
LDL Cholesterol: 105
Triglycerides: 100 (ref 40–160)

## 2021-04-12 LAB — HEMOGLOBIN A1C: Hemoglobin A1C: 6.4

## 2021-04-18 ENCOUNTER — Other Ambulatory Visit: Payer: Self-pay | Admitting: Medical

## 2021-04-18 ENCOUNTER — Telehealth: Payer: Self-pay

## 2021-04-18 MED ORDER — EMTRICITABINE-TENOFOVIR DF 200-300 MG PO TABS: 1.0000 | ORAL_TABLET | Freq: Every day | ORAL | 0 refills | Status: DC

## 2021-04-18 NOTE — Telephone Encounter (Signed)
Needs appt

## 2021-04-18 NOTE — Telephone Encounter (Signed)
Received fax from [facility] for a refill on the pts. Truvada pt. Last apt was 12/11/20.

## 2021-04-18 NOTE — Telephone Encounter (Signed)
Pt. Scheduled for med check 04/26/21 and will schedule CPE when he comes in for this appt.

## 2021-04-26 ENCOUNTER — Other Ambulatory Visit: Payer: Self-pay

## 2021-04-26 ENCOUNTER — Ambulatory Visit (INDEPENDENT_AMBULATORY_CARE_PROVIDER_SITE_OTHER): Payer: 59 | Admitting: Medical

## 2021-04-26 ENCOUNTER — Encounter: Payer: Self-pay | Admitting: Medical

## 2021-04-26 ENCOUNTER — Telehealth: Payer: Self-pay | Admitting: Medical

## 2021-04-26 VITALS — BP 138/88 | HR 94 | Ht 65.0 in | Wt 201.6 lb

## 2021-04-26 DIAGNOSIS — E1165 Type 2 diabetes mellitus with hyperglycemia: Secondary | ICD-10-CM

## 2021-04-26 DIAGNOSIS — R030 Elevated blood-pressure reading, without diagnosis of hypertension: Secondary | ICD-10-CM | POA: Diagnosis not present

## 2021-04-26 DIAGNOSIS — Z6833 Body mass index (BMI) 33.0-33.9, adult: Secondary | ICD-10-CM

## 2021-04-26 DIAGNOSIS — Z1211 Encounter for screening for malignant neoplasm of colon: Secondary | ICD-10-CM

## 2021-04-26 DIAGNOSIS — Z299 Encounter for prophylactic measures, unspecified: Secondary | ICD-10-CM

## 2021-04-26 DIAGNOSIS — Z79899 Other long term (current) drug therapy: Secondary | ICD-10-CM

## 2021-04-26 DIAGNOSIS — Z113 Encounter for screening for infections with a predominantly sexual mode of transmission: Secondary | ICD-10-CM

## 2021-04-26 DIAGNOSIS — E785 Hyperlipidemia, unspecified: Secondary | ICD-10-CM | POA: Diagnosis not present

## 2021-04-26 NOTE — Progress Notes (Signed)
Subjective:  Dean Wallace is a 50 y.o. male who presents for Chief Complaint  Patient presents with  . Medication Management    Med check. No concerns      Here for med check.  Diabetes-he is enrolled in a diabetes study currently.  There is about a month left on the study.  Morning sugars have been running in the mid 100s.  He is compliant with medication.  No foot lesions.  Elevated blood pressures-he does not have a blood pressure cuff.  He does have his blood pressure monitored with the study and numbers have been fine  Hyperlipidemia-compliant with medication without complaint  Prophylactic measure-he continues on PrEP Truvada.  No new sexual partners.  No current symptoms of concern.  No side effects of medication.  No other aggravating or relieving factors.    No other c/o.  Past Medical History:  Diagnosis Date  . Diabetes mellitus without complication ([place])    9833 out of control sugars, diet controlled 2016-2020  . 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 Family history of premature CAD   . Family history of prostate cancer   . Hyperlipidemia   . Myopia of both eyes 10/09/2017   Current Outpatient Medications on File Prior to Visit  Medication Sig Dispense Refill  . Continuous Blood Gluc Sensor ([place]) MISC 1 each by Does not apply route daily. 1 each 11  . emtricitabine-tenofovir (TRUVADA) 200-300 MG tablet Take 1 tablet by mouth daily. 90 tablet 0  . metFORMIN (GLUCOPHAGE) 500 MG tablet TAKE 1 TABLET BY MOUTH  TWICE DAILY WITH A MEAL 180 tablet 3  . NON FORMULARY Cagrilinpide and semaglutide- Diabetes study with pharmquest    . pravastatin (PRAVACHOL) 20 MG tablet Take 1 tablet (20 mg total) by mouth every evening. 90 tablet 3   No current facility-administered medications on file prior to visit.    The following portions of the patient's history were reviewed and updated as appropriate: allergies, current medications, past family  history, past medical history, past social history, past surgical history and problem list.  ROS Otherwise as in subjective above     Objective: BP 138/88   Pulse 94   Ht 5' 5"  (1.651 m)   Wt 201 lb 9.6 oz (91.4 kg)   SpO2 96%   BMI 33.55 kg/m   General appearance: alert, no distress, well developed, well nourished Neck: supple, no lymphadenopathy, no thyromegaly, no masses Heart: RRR, normal S1, S2, no murmurs Lungs: CTA bilaterally, no wheezes, rhonchi, or rales Pulses: 2+ radial pulses, 2+ pedal pulses, normal cap refill Ext: no edema  Diabetic Foot Exam - Simple   Simple Foot Form Diabetic Foot exam was performed with the following findings: Yes 04/26/2021 11:22 AM  Visual Inspection No deformities, no ulcerations, no other skin breakdown bilaterally: Yes Sensation Testing Intact to touch and monofilament testing bilaterally: Yes Pulse Check Posterior Tibialis and Dorsalis pulse intact bilaterally: Yes Comments       Assessment: Encounter Diagnoses  Name Primary?  . Type 2 diabetes mellitus with hyperglycemia, without long-term current use of insulin ([place]) Yes  . Hyperlipidemia, unspecified hyperlipidemia type   . BMI 33.0-33.9,adult   . Elevated blood-pressure reading without diagnosis of hypertension   . High risk medication use   . Screen for STD (sexually transmitted disease)   . Prophylactic measure   . Colon cancer screening      Plan: Diabetes- continue glucose monitoring, he is enrolled in  study currently.  Continue daily foot checks, yearly eye doctor visits.  We will request labs from the study  Hyperlipidemia-continue current medication  Prophylactic measure, high risk medication- counseled on medication, routine labs today for monitoring, advised condom use  Elevated blood pressure-we discussed blood pressure, risk of uncontrolled high blood pressure.  We will get records from the study which she has some blood pressure readings.  I advise he  check his blood pressures at home once or twice weekly.  We discussed goal being less than 130/80.  We discussed the fact that we may need to add some medication moving forward  Referral for cologard   Dean Wallace was seen today for medication management.  Diagnoses and all orders for this visit:  Type 2 diabetes mellitus with hyperglycemia, without long-term current use of insulin (HCC)  Hyperlipidemia, unspecified hyperlipidemia type  BMI 33.0-33.9,adult  Elevated blood-pressure reading without diagnosis of hypertension  High risk medication use -     HIV Antibody (routine testing w rflx) -     RPR -     GC/Chlamydia Probe Amp  Screen for STD (sexually transmitted disease) -     HIV Antibody (routine testing w rflx) -     RPR -     GC/Chlamydia Probe Amp  Prophylactic measure -     HIV Antibody (routine testing w rflx) -     RPR -     GC/Chlamydia Probe Amp  Colon cancer screening -     Cologuard    Follow up: pending labs

## 2021-04-26 NOTE — Telephone Encounter (Signed)
Please call pharmquest and get recent labs and BP readings.  He is in one of their studies

## 2021-04-28 LAB — GC/CHLAMYDIA PROBE AMP
Chlamydia trachomatis, NAA: NEGATIVE
Neisseria Gonorrhoeae by PCR: NEGATIVE

## 2021-04-30 ENCOUNTER — Other Ambulatory Visit: Payer: Self-pay | Admitting: Medical

## 2021-04-30 LAB — RPR: RPR Ser Ql: NONREACTIVE

## 2021-04-30 LAB — HIV ANTIBODY (ROUTINE TESTING W REFLEX): HIV Screen 4th Generation wRfx: NONREACTIVE

## 2021-04-30 NOTE — Telephone Encounter (Signed)
Results will be faxed over.

## 2021-05-02 ENCOUNTER — Encounter: Payer: Self-pay | Admitting: Medical

## 2021-05-03 LAB — HM DIABETES EYE EXAM

## 2021-05-06 ENCOUNTER — Encounter: Payer: Self-pay | Admitting: Medical

## 2021-05-08 LAB — COLOGUARD: Cologuard: NEGATIVE

## 2021-05-15 LAB — COLOGUARD: COLOGUARD: NEGATIVE

## 2021-05-17 ENCOUNTER — Telehealth: Payer: Self-pay | Admitting: Medical

## 2021-05-17 NOTE — Telephone Encounter (Signed)
Please let them know that the Cologuard screening for colon cancer was negative.  This indicates a lower likelihood that colorectal cancer is present.   Lets plan to repeat this in 3 years.  However, if the develop bowel changes, blood in stool, unexpected weight loss, or other new bowel changes, then recheck.

## 2021-05-17 NOTE — Telephone Encounter (Signed)
Pt aware.

## 2021-05-20 ENCOUNTER — Encounter: Payer: Self-pay | Admitting: Medical

## 2021-05-22 ENCOUNTER — Encounter: Payer: Self-pay | Admitting: Medical

## 2021-05-24 ENCOUNTER — Encounter: Payer: Self-pay | Admitting: Medical

## 2021-07-07 ENCOUNTER — Other Ambulatory Visit: Payer: Self-pay | Admitting: Medical

## 2021-07-16 ENCOUNTER — Other Ambulatory Visit: Payer: Self-pay | Admitting: Medical

## 2021-07-18 ENCOUNTER — Encounter: Payer: Self-pay | Admitting: Internal Medicine

## 2021-10-01 ENCOUNTER — Other Ambulatory Visit: Payer: Self-pay | Admitting: Medical

## 2021-10-10 ENCOUNTER — Other Ambulatory Visit: Payer: Self-pay | Admitting: Medical

## 2021-11-22 ENCOUNTER — Other Ambulatory Visit: Payer: Self-pay | Admitting: Medical

## 2022-01-30 ENCOUNTER — Other Ambulatory Visit: Payer: Self-pay | Admitting: Medical

## 2022-03-15 ENCOUNTER — Other Ambulatory Visit: Payer: Self-pay | Admitting: Medical

## 2022-03-17 ENCOUNTER — Encounter: Payer: Self-pay | Admitting: Internal Medicine

## 2022-04-25 ENCOUNTER — Other Ambulatory Visit: Payer: Self-pay | Admitting: Medical

## 2022-07-04 ENCOUNTER — Ambulatory Visit
Admission: RE | Admit: 2022-07-04 | Discharge: 2022-07-04 | Disposition: A | Discharge status: Home / Self Care | Payer: 59 | Source: Ambulatory Visit | Attending: Medical | Admitting: Medical

## 2022-07-04 ENCOUNTER — Ambulatory Visit (INDEPENDENT_AMBULATORY_CARE_PROVIDER_SITE_OTHER): Payer: 59 | Admitting: Medical

## 2022-07-04 ENCOUNTER — Encounter: Payer: Self-pay | Admitting: Medical

## 2022-07-04 VITALS — BP 120/68 | HR 97 | Ht 64.0 in | Wt 212.8 lb

## 2022-07-04 DIAGNOSIS — Z7185 Encounter for immunization safety counseling: Secondary | ICD-10-CM

## 2022-07-04 DIAGNOSIS — Z136 Encounter for screening for cardiovascular disorders: Secondary | ICD-10-CM

## 2022-07-04 DIAGNOSIS — E785 Hyperlipidemia, unspecified: Secondary | ICD-10-CM

## 2022-07-04 DIAGNOSIS — Z299 Encounter for prophylactic measures, unspecified: Secondary | ICD-10-CM | POA: Diagnosis not present

## 2022-07-04 DIAGNOSIS — Z8042 Family history of malignant neoplasm of prostate: Secondary | ICD-10-CM

## 2022-07-04 DIAGNOSIS — Z8709 Personal history of other diseases of the respiratory system: Secondary | ICD-10-CM

## 2022-07-04 DIAGNOSIS — Z0000 Encounter for general adult medical examination without abnormal findings: Secondary | ICD-10-CM

## 2022-07-04 DIAGNOSIS — E1165 Type 2 diabetes mellitus with hyperglycemia: Secondary | ICD-10-CM

## 2022-07-04 DIAGNOSIS — Z79899 Other long term (current) drug therapy: Secondary | ICD-10-CM

## 2022-07-04 DIAGNOSIS — M79671 Pain in right foot: Secondary | ICD-10-CM

## 2022-07-04 DIAGNOSIS — Z113 Encounter for screening for infections with a predominantly sexual mode of transmission: Secondary | ICD-10-CM

## 2022-07-04 DIAGNOSIS — Z8249 Family history of ischemic heart disease and other diseases of the circulatory system: Secondary | ICD-10-CM

## 2022-07-04 DIAGNOSIS — E1169 Type 2 diabetes mellitus with other specified complication: Secondary | ICD-10-CM

## 2022-07-04 DIAGNOSIS — Z6836 Body mass index (BMI) 36.0-36.9, adult: Secondary | ICD-10-CM

## 2022-07-04 LAB — POCT URINALYSIS DIP (PROADVANTAGE DEVICE)
Bilirubin, UA: NEGATIVE
Blood, UA: NEGATIVE
Glucose, UA: >=1000 mg/dL — AB
Leukocytes, UA: NEGATIVE
Nitrite, UA: NEGATIVE
Protein Ur, POC: NEGATIVE mg/dL
Specific Gravity, Urine: 1.015
Urobilinogen, Ur: NEGATIVE
pH, UA: 6 (ref 5.0–8.0)

## 2022-07-04 NOTE — Patient Instructions (Signed)
Please go to [facility] for your right foot xray.   Their hours are 8am - 4:30 pm Monday - Friday.  Take your insurance card with you.  [facility] [phone]  [address], [address] [place], [place] [address]  [address] [place], [place] [address]

## 2022-07-04 NOTE — Progress Notes (Signed)
Subjective:   HPI  Dean Wallace is a 51 y.o. male who presents for Chief Complaint  Patient presents with   fasting cpe,     Fasting cpe, toe pain on right foot from injury 3 months ago. Would like a refill on xanax. Last refill was 2 years ago    Patient Care Team: Jennings Stirling, Kermit Balo, PA-C as PCP - General (Family Medicine) Rosendo Gros, RN (Inactive) as Registered Nurse Sees dentist Sees eye doctor  Concerns: Diabetes-compliant with metformin.  He has not been as good with diet and exercise.  He has seen some readings up to 200 recently.  No polyuria, polydipsia, vision change.  Hyperlipidemia-compliant with Pravachol without complaint  He was taking PreP but ran out about a month ago.  He has concerns about his right small toe.  He stopped his toe about 3 months ago.  It originally was swollen and purple.  Although over time things got better it still hurts some  Reviewed their medical, surgical, family, social, medication, and allergy history and updated chart as appropriate.  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   VATS surgery   Family history of premature CAD    Family history of prostate cancer    Hyperlipidemia    Myopia of both eyes 10/09/2017    Past Surgical History:  Procedure Laterality Date   DECORTICATION Right 03/12/2014   Procedure: DECORTICATION;  Surgeon: Loreli Slot, MD;  Location: [facility] OR;  Service: Thoracic;  Laterality: Right;   lung drained     VIDEO ASSISTED THORACOSCOPY (VATS)/EMPYEMA Right 03/12/2014   Procedure: VIDEO ASSISTED THORACOSCOPY (VATS)/EMPYEMA;  Surgeon: Loreli Slot, MD;  Location: [facility] OR;  Service: Thoracic;  Laterality: Right;      Family History  Problem Relation Age of Onset   COPD Mother    Heart disease Mother        CHF   Hypertension Mother    Cancer Mother         skin   Kidney disease Father    Cancer Father 31       prostate   Heart disease Father 22       CHF, MI    Hypertension Father    Arthritis Sister    Diabetes Brother    Cancer Maternal Grandmother        breast     Current Outpatient Medications:    Continuous Blood Gluc Sensor (FREESTYLE LIBRE SENSOR SYSTEM) MISC, 1 each by Does not apply route daily., Disp: 1 each, Rfl: 11   metFORMIN (GLUCOPHAGE) 500 MG tablet, TAKE 1 TABLET BY MOUTH TWICE  DAILY WITH MEALS, Disp: 60 tablet, Rfl: 1   pravastatin (PRAVACHOL) 20 MG tablet, TAKE 1 TABLET BY MOUTH IN THE  EVENING, Disp: 30 tablet, Rfl: 1   emtricitabine-tenofovir (TRUVADA) 200-300 MG tablet, TAKE 1 TABLET BY MOUTH  DAILY (Patient not taking: Reported on 07/04/2022), Disp: 90 tablet, Rfl: 0  No Known Allergies   Review of Systems Constitutional: -fever, -chills, -sweats, -unexpected weight change, -decreased appetite, -fatigue Allergy: -sneezing, -itching, -congestion Dermatology: -changing moles, --rash, -lumps ENT: -runny nose, -ear pain, -sore throat, -hoarseness, -sinus pain, -teeth pain, - ringing in ears, -hearing loss, -nosebleeds Cardiology: -chest pain, -palpitations, -swelling, -difficulty breathing when lying flat, -waking up short of breath Respiratory: -cough, -shortness of breath, -difficulty breathing with exercise or  exertion, -wheezing, -coughing up blood Gastroenterology: -abdominal pain, -nausea, -vomiting, -diarrhea, -constipation, -blood in stool, -changes in bowel movement, -difficulty swallowing or eating Hematology: -bleeding, -bruising  Musculoskeletal: +toe pain, otherwise no joint aches, -muscle aches, -joint swelling, -back pain, -neck pain, -cramping, -changes in gait Ophthalmology: denies vision changes, eye redness, itching, discharge Urology: -burning with urination, -difficulty urinating, -blood in urine, -urinary frequency, -urgency, -incontinence Neurology: -headache, -weakness, -tingling,  -numbness, -memory loss, -falls, -dizziness Psychology: -depressed mood, -agitation, -sleep problems Male GU: no testicular mass, pain, no lymph nodes swollen, no swelling, no rash.     Objective:  BP 120/68   Pulse 97   Ht 5\' 4"  (1.626 m)   Wt 212 lb 12.8 oz (96.5 kg)   BMI 36.53 kg/m   General appearance: alert, no distress, WD/WN, Caucasian male Skin: no worrisome lesions Neck: supple, no lymphadenopathy, no thyromegaly, no masses, normal ROM, no bruits Chest: non tender, normal shape and expansion Heart: RRR, normal S1, S2, no murmurs Lungs: CTA bilaterally, no wheezes, rhonchi, or rales Abdomen: +bs, soft, non tender, non distended, no masses, no hepatomegaly, no splenomegaly, no bruits Back: non tender, normal ROM, no scoliosis Musculoskeletal: mild tendnerss of right 5th toe and distal metatarsal, normal toe ROM, otherwise upper extremities non tender, no obvious deformity, normal ROM throughout, lower extremities non tender, no obvious deformity, normal ROM throughout Extremities: no edema, no cyanosis, no clubbing Pulses: 2+ symmetric, upper and lower extremities, normal cap refill Neurological: alert, oriented x 3, CN2-12 intact, strength normal upper extremities and lower extremities, sensation normal throughout, DTRs 2+ throughout, no cerebellar signs, gait normal Psychiatric: normal affect, behavior normal, pleasant  GU: normal male external genitalia,circumcised, nontender, no masses, no hernia, no lymphadenopathy Rectal: deferred  Diabetic Foot Exam - Simple   Simple Foot Form Diabetic Foot exam was performed with the following findings: Yes 07/04/2022  3:13 PM  Visual Inspection No deformities, no ulcerations, no other skin breakdown bilaterally: Yes Sensation Testing Intact to touch and monofilament testing bilaterally: Yes Pulse Check Posterior Tibialis and Dorsalis pulse intact bilaterally: Yes Comments But decreased cap refill of bilat toes        Assessment and Plan :   Encounter Diagnoses  Name Primary?   Routine general medical examination at a health care facility Yes   Screen for STD (sexually transmitted disease)    Screening for heart disease    Vaccine counseling    Prophylactic measure    Hyperlipidemia associated with type 2 diabetes mellitus (HCC)    History of empyema of pleura    High risk medication use    Family history of prostate cancer    Family history of premature CAD    Type 2 diabetes mellitus with hyperglycemia, without long-term current use of insulin (HCC)    BMI 36.0-36.9,adult    Right foot pain       This visit was a preventative care visit, also known as wellness visit or routine physical.   Topics typically include healthy lifestyle, diet, exercise, preventative care, vaccinations, sick and well care, proper use of emergency dept and after hours care, as well as other concerns.     Recommendations: Continue to return yearly for your annual wellness and preventative care visits.  This gives [place] a chance to discuss healthy lifestyle, exercise, vaccinations, review your chart record, and perform screenings where appropriate.  I recommend you see your eye doctor yearly for routine vision care.  I recommend you see your dentist yearly for routine dental  care including hygiene visits twice yearly.   Vaccination recommendations were reviewed Immunization History  Administered Date(s) Administered   Hepb-cpg 09/07/2020, 10/09/2020   Influenza,inj,Quad PF,6+ Mos 02/02/2015, 09/07/2020   PFIZER(Purple Top)SARS-COV-2 Vaccination 03/24/2020, 04/18/2020, 10/22/2020   Pneumococcal Conjugate-13 06/29/2020   Pneumococcal Polysaccharide-23 02/02/2015   Tdap 02/02/2015    I recommend a yearly flu shot   Screening for cancer: Colon cancer screening: Cologuard negative 2022  We discussed PSA, prostate exam, and prostate cancer screening risks/benefits.   Plan for testing 2024  Skin cancer  screening: Check your skin regularly for new changes, growing lesions, or other lesions of concern Come in for evaluation if you have skin lesions of concern.  Lung cancer screening: If you have a greater than 20 pack year history of tobacco use, then you may qualify for lung cancer screening with a chest CT scan.   Please call your insurance company to inquire about coverage for this test.  We currently don't have screenings for other cancers besides breast, cervical, colon, and lung cancers.  If you have a strong family history of cancer or have other cancer screening concerns, please let me know.    Bone health: Get at least 150 minutes of aerobic exercise weekly Get weight bearing exercise at least once weekly Bone density test:  A bone density test is an imaging test that uses a type of X-ray to measure the amount of calcium and other minerals in your bones. The test may be used to diagnose or screen you for a condition that causes weak or thin bones (osteoporosis), predict your risk for a broken bone (fracture), or determine how well your osteoporosis treatment is working. The bone density test is recommended for females 18 and older, or females or males [id number] if certain risk factors such as thyroid disease, long term use of steroids such as for asthma or rheumatological issues, vitamin D deficiency, estrogen deficiency, family history of osteoporosis, self or family history of fragility fracture in first degree relative.    Heart health: Get at least 150 minutes of aerobic exercise weekly Limit alcohol It is important to maintain a healthy blood pressure and healthy cholesterol numbers  Heart disease screening: Screening for heart disease includes screening for blood pressure, fasting lipids, glucose/diabetes screening, BMI height to weight ratio, reviewed of smoking status, physical activity, and diet.    Goals include blood pressure 120/80 or less, maintaining a healthy  lipid/cholesterol profile, preventing diabetes or keeping diabetes numbers under good control, not smoking or using tobacco products, exercising most days per week or at least 150 minutes per week of exercise, and eating healthy variety of fruits and vegetables, healthy oils, and avoiding unhealthy food choices like fried food, fast food, high sugar and high cholesterol foods.    Other tests may possibly include EKG test, CT coronary calcium score, echocardiogram, exercise treadmill stress test.   Consider coronary CT vs cardiology referral given heart disease risk factors   Medical care options: I recommend you continue to seek care here first for routine care.  We try really hard to have available appointments Monday through Friday daytime hours for sick visits, acute visits, and physicals.  Urgent care should be used for after hours and weekends for significant issues that cannot wait till the next day.  The emergency department should be used for significant potentially life-threatening emergencies.  The emergency department is expensive, can often have long wait times for less significant concerns, so try to utilize primary care, urgent  care, or telemedicine when possible to avoid unnecessary trips to the emergency department.  Virtual visits and telemedicine have been introduced since the pandemic started in 2020, and can be convenient ways to receive medical care.  We offer virtual appointments as well to assist you in a variety of options to seek medical care.    Separate significant issues discussed: Diabetes-updated labs today.  Needs to get back on a more aggressive program to keep blood sugars and control through healthy diet and exercise.  In the past he has demonstrated that he can significantly improve his lifestyle with diet and exercise changes.  Hyperlipidemia-continue statin, labs today  Prep, prophylactic measure-updated STD screen today, pending labs, if HIV and hepatitis B  normal can get back on Truvada  BMI 36-work on efforts to lose weight through healthy diet and exercise  Toe pain-I put in a baseline x-ray.  He can go to [facility] for x-ray at his convenience  Dean Wallace was seen today for fasting cpe, .  Diagnoses and all orders for this visit:  Routine general medical examination at a health care facility -     Comprehensive metabolic panel -     CBC -     Lipid panel -     Hemoglobin A1c -     Microalbumin/Creatinine Ratio, Urine -     POCT Urinalysis DIP (Proadvantage Device) -     RPR+HIV+GC+CT Panel -     Hepatitis C antibody -     Hepatitis B surface antigen  Screen for STD (sexually transmitted disease) -     RPR+HIV+GC+CT Panel -     Hepatitis C antibody -     Hepatitis B surface antigen  Screening for heart disease  Vaccine counseling  Prophylactic measure -     RPR+HIV+GC+CT Panel -     Hepatitis C antibody -     Hepatitis B surface antigen  Hyperlipidemia associated with type 2 diabetes mellitus (HCC) -     Lipid panel  History of empyema of pleura  High risk medication use -     RPR+HIV+GC+CT Panel -     Hepatitis C antibody -     Hepatitis B surface antigen  Family history of prostate cancer  Family history of premature CAD  Type 2 diabetes mellitus with hyperglycemia, without long-term current use of insulin (HCC) -     Hemoglobin A1c -     Microalbumin/Creatinine Ratio, Urine  BMI 36.0-36.9,adult  Right foot pain -     DG Foot Complete Right; Future    Follow-up pending labs, yearly for physical

## 2022-07-08 ENCOUNTER — Other Ambulatory Visit: Payer: Self-pay | Admitting: Medical

## 2022-07-08 ENCOUNTER — Telehealth: Payer: Self-pay | Admitting: Medical

## 2022-07-08 ENCOUNTER — Telehealth: Payer: Self-pay | Admitting: Family Medicine

## 2022-07-08 MED ORDER — SEMAGLUTIDE(0.25 OR 0.5MG/DOS) 2 MG/3ML ~~LOC~~ SOPN: 0.2500 mg | PEN_INJECTOR | SUBCUTANEOUS | 0 refills | Status: DC

## 2022-07-08 MED ORDER — METFORMIN HCL 850 MG PO TABS: 850.0000 mg | ORAL_TABLET | Freq: Two times a day (BID) | ORAL | 0 refills | Status: DC

## 2022-07-08 MED ORDER — PRAVASTATIN SODIUM 20 MG PO TABS: 20.0000 mg | ORAL_TABLET | Freq: Every evening | ORAL | 3 refills | Status: DC

## 2022-07-08 MED ORDER — EMTRICITABINE-TENOFOVIR DF 200-300 MG PO TABS: 1.0000 | ORAL_TABLET | Freq: Every day | ORAL | 0 refills | Status: DC

## 2022-07-08 NOTE — Telephone Encounter (Signed)
Pt left message that the meds you were wanting to add, he said his insurance advised they would need prior auth. Please advise pt.

## 2022-07-08 NOTE — Telephone Encounter (Signed)
Labs seen on mychart. Spoke to pt and gave recommendations. He is scheduled for follow up in November.

## 2022-07-08 NOTE — Telephone Encounter (Signed)
Pt called and states he would like to try Ozempic, states he called his insurance and is sure they cover it but he will need a prior authorization.

## 2022-07-09 LAB — COMPREHENSIVE METABOLIC PANEL
ALT: 52 IU/L — ABNORMAL HIGH (ref 0–44)
AST: 23 IU/L (ref 0–40)
Albumin/Globulin Ratio: 1.8 (ref 1.2–2.2)
Albumin: 4.6 g/dL (ref 4.1–5.1)
Alkaline Phosphatase: 108 IU/L (ref 44–121)
BUN/Creatinine Ratio: 15 (ref 9–20)
BUN: 13 mg/dL (ref 6–24)
Bilirubin Total: 0.5 mg/dL (ref 0.0–1.2)
CO2: 23 mmol/L (ref 20–29)
Calcium: 9.8 mg/dL (ref 8.7–10.2)
Chloride: 99 mmol/L (ref 96–106)
Creatinine, Ser: 0.87 mg/dL (ref 0.76–1.27)
Globulin, Total: 2.5 g/dL (ref 1.5–4.5)
Glucose: 336 mg/dL — ABNORMAL HIGH (ref 70–99)
Potassium: 4.6 mmol/L (ref 3.5–5.2)
Sodium: 137 mmol/L (ref 134–144)
Total Protein: 7.1 g/dL (ref 6.0–8.5)
eGFR: 106 mL/min/{1.73_m2} (ref >59)

## 2022-07-09 LAB — HEPATITIS C ANTIBODY: Hep C Virus Ab: NONREACTIVE

## 2022-07-09 LAB — LIPID PANEL
Chol/HDL Ratio: 5.5 ratio — ABNORMAL HIGH (ref 0.0–5.0)
Cholesterol, Total: 213 mg/dL — ABNORMAL HIGH (ref 100–199)
HDL: 39 mg/dL — ABNORMAL LOW
LDL Chol Calc (NIH): 156 mg/dL — ABNORMAL HIGH (ref 0–99)
Triglycerides: 101 mg/dL (ref 0–149)
VLDL Cholesterol Cal: 18 mg/dL (ref 5–40)

## 2022-07-09 LAB — RPR+HIV+GC+CT PANEL
Chlamydia trachomatis, NAA: NEGATIVE
HIV Screen 4th Generation wRfx: NONREACTIVE
Neisseria Gonorrhoeae by PCR: NEGATIVE
RPR Ser Ql: NONREACTIVE

## 2022-07-09 LAB — MICROALBUMIN / CREATININE URINE RATIO
Creatinine, Urine: 121.5 mg/dL
Microalb/Creat Ratio: 8 mg/g creat (ref 0–29)
Microalbumin, Urine: 10.1 ug/mL

## 2022-07-09 LAB — CBC
Hematocrit: 49 % (ref 37.5–51.0)
Hemoglobin: 16.8 g/dL (ref 13.0–17.7)
MCH: 31.3 pg (ref 26.6–33.0)
MCHC: 34.3 g/dL (ref 31.5–35.7)
MCV: 91 fL (ref 79–97)
Platelets: 315 x10E3/uL (ref 150–450)
RBC: 5.36 x10E6/uL (ref 4.14–5.80)
RDW: 11.8 % (ref 11.6–15.4)
WBC: 10.6 x10E3/uL (ref 3.4–10.8)

## 2022-07-09 LAB — HEPATITIS B SURFACE ANTIGEN: Hepatitis B Surface Ag: NEGATIVE

## 2022-07-09 LAB — HEMOGLOBIN A1C
Est. average glucose Bld gHb Est-mCnc: 338 mg/dL
Hgb A1c MFr Bld: 13.4 % — ABNORMAL HIGH (ref 4.8–5.6)

## 2022-07-10 ENCOUNTER — Telehealth: Payer: Self-pay | Admitting: Medical

## 2022-07-10 NOTE — Telephone Encounter (Signed)
[employer] approved and went thru for $0 co pay, pt picked up

## 2022-07-11 ENCOUNTER — Telehealth: Payer: Self-pay | Admitting: Medical

## 2022-07-11 ENCOUNTER — Other Ambulatory Visit: Payer: Self-pay | Admitting: Medical

## 2022-07-11 MED ORDER — SEMAGLUTIDE (1 MG/DOSE) 4 MG/3ML ~~LOC~~ SOPN: 1.0000 mg | PEN_INJECTOR | SUBCUTANEOUS | 0 refills | Status: DC

## 2022-07-11 NOTE — Telephone Encounter (Signed)
done

## 2022-07-11 NOTE — Telephone Encounter (Signed)
[facility] sent refills request for ozempic please send to the [employer] [facility]) - [place], [place] - [address] [place]

## 2022-07-18 ENCOUNTER — Encounter: Payer: Self-pay | Admitting: Internal Medicine

## 2022-07-22 ENCOUNTER — Encounter: Payer: Self-pay | Admitting: Medical

## 2022-08-03 ENCOUNTER — Other Ambulatory Visit: Payer: Self-pay | Admitting: Medical

## 2022-08-04 NOTE — Telephone Encounter (Signed)
Has an appt in [place]

## 2022-08-20 ENCOUNTER — Encounter: Payer: Self-pay | Admitting: Internal Medicine

## 2022-08-21 ENCOUNTER — Other Ambulatory Visit: Payer: Self-pay | Admitting: Medical

## 2022-08-22 NOTE — Telephone Encounter (Signed)
Pt has an appt in November 

## 2022-09-23 ENCOUNTER — Encounter: Payer: Self-pay | Admitting: Internal Medicine

## 2022-09-24 ENCOUNTER — Other Ambulatory Visit: Payer: Self-pay | Admitting: Internal Medicine

## 2022-09-24 MED ORDER — METFORMIN HCL 850 MG PO TABS: 850.0000 mg | ORAL_TABLET | Freq: Two times a day (BID) | ORAL | 1 refills | Status: DC

## 2022-10-11 ENCOUNTER — Other Ambulatory Visit: Payer: Self-pay | Admitting: Medical

## 2022-10-23 ENCOUNTER — Telehealth: Payer: Self-pay

## 2022-10-23 ENCOUNTER — Ambulatory Visit (INDEPENDENT_AMBULATORY_CARE_PROVIDER_SITE_OTHER): Payer: 59 | Admitting: Medical

## 2022-10-23 VITALS — BP 120/80 | HR 96 | Wt 204.0 lb

## 2022-10-23 DIAGNOSIS — E1165 Type 2 diabetes mellitus with hyperglycemia: Secondary | ICD-10-CM

## 2022-10-23 DIAGNOSIS — Z23 Encounter for immunization: Secondary | ICD-10-CM

## 2022-10-23 DIAGNOSIS — E1169 Type 2 diabetes mellitus with other specified complication: Secondary | ICD-10-CM

## 2022-10-23 DIAGNOSIS — Z79899 Other long term (current) drug therapy: Secondary | ICD-10-CM

## 2022-10-23 DIAGNOSIS — Z113 Encounter for screening for infections with a predominantly sexual mode of transmission: Secondary | ICD-10-CM

## 2022-10-23 DIAGNOSIS — Z299 Encounter for prophylactic measures, unspecified: Secondary | ICD-10-CM

## 2022-10-23 DIAGNOSIS — Z7185 Encounter for immunization safety counseling: Secondary | ICD-10-CM | POA: Diagnosis not present

## 2022-10-23 DIAGNOSIS — E785 Hyperlipidemia, unspecified: Secondary | ICD-10-CM

## 2022-10-23 DIAGNOSIS — Z8249 Family history of ischemic heart disease and other diseases of the circulatory system: Secondary | ICD-10-CM

## 2022-10-23 MED ORDER — FREESTYLE LIBRE 3 SENSOR MISC: 2.0000 | 11 refills | Status: DC

## 2022-10-23 NOTE — Telephone Encounter (Signed)
Pt. Was calling back to let you know that he called his ins. And they told him his Free [employer] 3 is covered by his ins. He wanted to know if you could call it into his pharmacy now.

## 2022-10-23 NOTE — Progress Notes (Signed)
Subjective:  Dean Wallace is a 51 y.o. male who presents for Chief Complaint  Patient presents with   4 month follow-up    4 month follow-up on diabetes and prep, wants covid and flu shot today today. Father passed away 2.5 weeks ago and just having some griefing with that     Diabetes-compliant with Ozempic 1 mg weekly, metformin 850 mg twice daily.  Check sugars with a glucometer.  He would like to potentially consider a continuous monitor.  He was on Dexcom when he was in a drug study a few.  Sugars have been running high but not as bad as July.  Hyperlipidemia-compliant with Pravachol without complaint  He has been working on [employer] and weight loss since July.  Unfortunately his father passed away 2.5 weeks ago due to a heart attack  He would like a COVID flu shot today  He continues on Truvada prep.  No new sexual partner since July.  No concerns or symptoms.  No other aggravating or relieving factors.    No other c/o.  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 1 each 11   emtricitabine-tenofovir (TRUVADA) 200-300 MG tablet TAKE 1 TABLET BY MOUTH DAILY 90 tablet 0   metFORMIN (GLUCOPHAGE) 850 MG tablet Take 1 tablet (850 mg total) by mouth 2 (two) times daily with a meal. 180 tablet 1   OZEMPIC, 1 MG/DOSE, 4 MG/3ML SOPN INJECT SUBCUTANEOUSLY 1 MG EVERY WEEK 9 mL 0   pravastatin (PRAVACHOL) 20 MG tablet Take 1 tablet (20 mg total) by mouth every evening. 90 tablet 3   No current facility-administered  medications on file prior to visit.     The following portions of the patient's history were reviewed and updated as appropriate: allergies, current medications, past family history, past medical history, past social history, past surgical history and problem list.  ROS Otherwise as in subjective above  Objective: BP 120/80   Pulse 96   Wt 204 lb (92.5 kg)   BMI 35.02 kg/m   General appearance: alert, no distress, well developed, well nourished Neck: supple, no lymphadenopathy, no thyromegaly, no masses Heart: RRR, normal S1, S2, no murmurs Lungs: CTA bilaterally, no wheezes, rhonchi, or rales Pulses: 2+ radial pulses, 2+ pedal pulses, normal cap refill Ext: no edema   Assessment: Encounter Diagnoses  Name Primary?   Type 2 diabetes mellitus with hyperglycemia, without long-term current use of insulin (HCC) Yes   COVID-19 vaccine administered    Needs flu shot    Vaccine counseling    Hyperlipidemia associated with type 2 diabetes mellitus (HCC)    High risk medication use    Prophylactic measure    Screen for STD (sexually transmitted disease)    Family history of heart disease      Plan: Diabetes-updated labs today, given his home readings we will likely need to increase both metformin and Ozempic.  Continue glucose monitoring, we discussed freestyle libre system today.  He has used Dexcom in  the past with drug study.  Hyperlipidemia-continue Pravachol, updated lipids today.  Lipids were not at goal in July but he has made dietary changes since then.  Congratulated him on his weight loss since July.  STD screening, risk medication use, prophylactic measure-HIV screening today, continue Truvada prep, condom use.  Continue routine follow-up on this medication.  Counseled on the influenza virus vaccine.  Vaccine information sheet given.  Influenza vaccine given after consent obtained.  Counseled on the Covid virus vaccine.  Vaccine information sheet given.  Covid  vaccine given after consent obtained.   Gavan was seen today for 4 month follow-up.  Diagnoses and all orders for this visit:  Type 2 diabetes mellitus with hyperglycemia, without long-term current use of insulin (HCC) -     Hemoglobin A1c -     CT CARDIAC SCORING ([facility] LOCATIONS ONLY); Future  COVID-19 vaccine administered -     Pfizer Fall 2023 Covid-19 Vaccine 38yrs and older  Needs flu shot -     Flu Vaccine QUAD 31mo+IM (Fluarix, Fluzone & Alfiuria Quad PF)  Vaccine counseling  Hyperlipidemia associated with type 2 diabetes mellitus (HCC) -     Lipid panel -     ALT -     CT CARDIAC SCORING ([facility] LOCATIONS ONLY); Future  High risk medication use -     ALT  Prophylactic measure -     HIV Antibody (routine testing w rflx)  Screen for STD (sexually transmitted disease) -     HIV Antibody (routine testing w rflx)  Family history of heart disease -     CT CARDIAC SCORING ([facility] LOCATIONS ONLY); Future  Other orders -     Continuous Blood Gluc Sensor (FREESTYLE LIBRE 3 SENSOR) MISC; 2 each by Does not apply route every 14 (fourteen) days. Place 1 sensor on the skin every 14 days. Use to check glucose continuously    Follow up: pending labs

## 2022-10-24 ENCOUNTER — Other Ambulatory Visit: Payer: Self-pay | Admitting: Medical

## 2022-10-24 LAB — ALT: ALT: 42 IU/L (ref 0–44)

## 2022-10-24 LAB — LIPID PANEL
Chol/HDL Ratio: 3.7 ratio (ref 0.0–5.0)
Cholesterol, Total: 129 mg/dL (ref 100–199)
HDL: 35 mg/dL — ABNORMAL LOW (ref >39)
LDL Chol Calc (NIH): 77 mg/dL (ref 0–99)
Triglycerides: 88 mg/dL (ref 0–149)
VLDL Cholesterol Cal: 17 mg/dL (ref 5–40)

## 2022-10-24 LAB — HIV ANTIBODY (ROUTINE TESTING W REFLEX): HIV Screen 4th Generation wRfx: NONREACTIVE

## 2022-10-24 LAB — HEMOGLOBIN A1C
Est. average glucose Bld gHb Est-mCnc: 226 mg/dL
Hgb A1c MFr Bld: 9.5 % — ABNORMAL HIGH (ref 4.8–5.6)

## 2022-10-24 MED ORDER — OZEMPIC (2 MG/DOSE) 8 MG/3ML ~~LOC~~ SOPN: 2.0000 mg | PEN_INJECTOR | SUBCUTANEOUS | 5 refills | Status: DC

## 2022-10-24 MED ORDER — METFORMIN HCL 1000 MG PO TABS: 1000.0000 mg | ORAL_TABLET | Freq: Two times a day (BID) | ORAL | 1 refills | Status: DC

## 2022-10-28 ENCOUNTER — Encounter: Payer: Self-pay | Admitting: Medical

## 2022-10-31 ENCOUNTER — Ambulatory Visit: Payer: 59 | Admitting: Medical

## 2022-11-08 ENCOUNTER — Other Ambulatory Visit: Payer: Self-pay | Admitting: Medical

## 2022-11-11 ENCOUNTER — Other Ambulatory Visit: Payer: Self-pay | Admitting: Internal Medicine

## 2022-11-11 MED ORDER — EMTRICITABINE-TENOFOVIR DF 200-300 MG PO TABS: 1.0000 | ORAL_TABLET | Freq: Every day | ORAL | 0 refills | Status: DC

## 2022-11-11 NOTE — Telephone Encounter (Signed)
From: Caryn Bee Matlack To: Office of Kristian Covey, [place] Sent: 11/10/2022 6:41 PM EST Subject: Medication Renewal Request  Refills have been requested for the following medications:   emtricitabine-tenofovir (TRUVADA) 200-300 MG tablet [Shane Tysinger]  Preferred pharmacy: [facility] - [place], [place] - [address] Delivery method: [employer]

## 2022-12-03 ENCOUNTER — Other Ambulatory Visit: Payer: Self-pay

## 2023-01-02 ENCOUNTER — Ambulatory Visit
Admission: RE | Admit: 2023-01-02 | Discharge: 2023-01-02 | Disposition: A | Discharge status: Home / Self Care | Payer: No Typology Code available for payment source | Source: Ambulatory Visit | Attending: Medical | Admitting: Medical

## 2023-01-02 DIAGNOSIS — E1169 Type 2 diabetes mellitus with other specified complication: Secondary | ICD-10-CM

## 2023-01-02 DIAGNOSIS — E1165 Type 2 diabetes mellitus with hyperglycemia: Secondary | ICD-10-CM

## 2023-01-02 DIAGNOSIS — Z8249 Family history of ischemic heart disease and other diseases of the circulatory system: Secondary | ICD-10-CM

## 2023-01-05 NOTE — Progress Notes (Signed)
Results sent through MyChart

## 2023-01-07 ENCOUNTER — Other Ambulatory Visit: Payer: Self-pay | Admitting: Medical

## 2023-01-07 DIAGNOSIS — E1169 Type 2 diabetes mellitus with other specified complication: Secondary | ICD-10-CM

## 2023-01-07 DIAGNOSIS — Z8249 Family history of ischemic heart disease and other diseases of the circulatory system: Secondary | ICD-10-CM

## 2023-01-07 DIAGNOSIS — I2510 Atherosclerotic heart disease of native coronary artery without angina pectoris: Secondary | ICD-10-CM

## 2023-01-07 DIAGNOSIS — E1165 Type 2 diabetes mellitus with hyperglycemia: Secondary | ICD-10-CM

## 2023-01-07 MED ORDER — ROSUVASTATIN CALCIUM 20 MG PO TABS: 20.0000 mg | ORAL_TABLET | Freq: Every day | ORAL | 3 refills | Status: DC

## 2023-01-15 ENCOUNTER — Ambulatory Visit: Payer: 59 | Attending: Internal Medicine | Admitting: Internal Medicine

## 2023-01-15 ENCOUNTER — Encounter: Payer: Self-pay | Admitting: Internal Medicine

## 2023-01-15 VITALS — BP 140/94 | HR 105 | Ht 65.0 in | Wt 205.4 lb

## 2023-01-15 DIAGNOSIS — Z8249 Family history of ischemic heart disease and other diseases of the circulatory system: Secondary | ICD-10-CM | POA: Diagnosis not present

## 2023-01-15 DIAGNOSIS — Z79899 Other long term (current) drug therapy: Secondary | ICD-10-CM | POA: Diagnosis not present

## 2023-01-15 DIAGNOSIS — E1165 Type 2 diabetes mellitus with hyperglycemia: Secondary | ICD-10-CM

## 2023-01-15 DIAGNOSIS — E785 Hyperlipidemia, unspecified: Secondary | ICD-10-CM

## 2023-01-15 DIAGNOSIS — Z0000 Encounter for general adult medical examination without abnormal findings: Secondary | ICD-10-CM

## 2023-01-15 DIAGNOSIS — E1169 Type 2 diabetes mellitus with other specified complication: Secondary | ICD-10-CM | POA: Diagnosis not present

## 2023-01-15 MED ORDER — ROSUVASTATIN CALCIUM 40 MG PO TABS: 40.0000 mg | ORAL_TABLET | Freq: Every day | ORAL | 3 refills | Status: DC

## 2023-01-15 MED ORDER — ASPIRIN 81 MG PO TBEC: 81.0000 mg | DELAYED_RELEASE_TABLET | Freq: Every day | ORAL | 3 refills | Status: AC

## 2023-01-15 NOTE — Patient Instructions (Signed)
Medication Instructions:  Crestor 40 mg a day  Aspirin 81 mg a day   *If you need a refill on your cardiac medications before your next appointment, please call your pharmacy*   Lab Work: Nmr, apo b, lipo a, hgba1c, cbc, hepatic in 8 weeks  If you have labs (blood work) drawn today and your tests are completely normal, you will receive your results only by: [place] (if you have MyChart) OR A paper copy in the mail If you have any lab test that is abnormal or we need to change your treatment, we will call you to review the results.   Testing/Procedures: How to Prepare for Your Cardiac PET/CT Stress Test:  1. Please do not take these medications before your test:   Medications that may interfere with the cardiac pharmacological stress agent (ex. nitrates - including erectile dysfunction medications) the day of the exam. (Erectile dysfunction medication should be held for at least 72 hrs prior to test) Your remaining medications may be taken with water.  2. Nothing to eat or drink, except water, 3 hours prior to arrival time.   NO caffeine/decaffeinated products, or chocolate 12 hours prior to arrival.  3. NO perfume, cologne or lotion  4. Total time is 1 to 2 hours; you may want to bring reading material for the waiting time.  5. Please report to Admitting at the [facility] Main Entrance 30 minutes early for your test.  [place], [place] [address]  Diabetic Preparation:  Hold oral medications.Marland KitchenMarland KitchenMarland KitchenMetformin  Check blood sugars prior to leaving the house. If able to eat breakfast prior to 3 hour fasting, you may take all medications, including your insulin, Do not worry if you miss your breakfast dose of insulin - start at your next meal.  In preparation for your appointment, medication and supplies will be purchased.  Appointment availability is limited, so if you need to cancel or reschedule, please call the Radiology Department at [phone]   24 hours in advance to avoid a cancellation fee of $100.00  What to Expect After you Arrive:  Once you arrive and check in for your appointment, you will be taken to a preparation room within the Radiology Department.  A technologist or Nurse will obtain your medical history, verify that you are correctly prepped for the exam, and explain the procedure.  Afterwards,  an IV will be started in your arm and electrodes will be placed on your skin for EKG monitoring during the stress portion of the exam. Then you will be escorted to the PET/CT scanner.  There, staff will get you positioned on the scanner and obtain a blood pressure and EKG.  During the exam, you will continue to be connected to the EKG and blood pressure machines.  A small, safe amount of a radioactive tracer will be injected in your IV to obtain a series of pictures of your heart along with an injection of a stress agent.    After your Exam:  It is recommended that you eat a meal and drink a caffeinated beverage to counter act any effects of the stress agent.  Drink plenty of fluids for the remainder of the day and urinate frequently for the first couple of hours after the exam.  Your doctor will inform you of your test results within 7-10 business days.  For questions about your test or how to prepare for your test, please call: Marchia Bond, Cardiac Imaging Nurse Navigator  Gordy Clement,  Cardiac Imaging Nurse Navigator Office: [phone]    Follow-Up: At [facility], you and your health needs are our priority.  As part of our continuing mission to provide you with exceptional heart care, we have created designated Provider Care Teams.  These Care Teams include your primary Cardiologist (physician) and Advanced Practice Providers (APPs -  Physician Assistants and Nurse Practitioners) who all work together to provide you with the care you need, when you need it.  We recommend signing up for the patient portal called  "MyChart".  Sign up information is provided on this After Visit Summary.  MyChart is used to connect with patients for Virtual Visits (Telemedicine).  Patients are able to view lab/test results, encounter notes, upcoming appointments, etc.  Non-urgent messages can be sent to your provider as well.   To learn more about what you can do with MyChart, go to [email or web address].

## 2023-01-15 NOTE — Progress Notes (Signed)
Cardiology Office Note   Date:  01/15/2023   ID:  Dean Wallace, DOB 1971-10-05, MRN [id number]  PCP:  Carlena Hurl, PA-C  Cardiologist:   Dorris Carnes, MD   Patient referreed for evaluation of CAD   Had Ca score CT      History of Present Illness: Dean Wallace is a 52 y.o. male with a history of DM (Dx in 2015), HL (started statin in 2018)     Recently had a Ca score  CT done  His Ca score was   726 (99%ile for age, sex0   Calcifications present in LAD, LCx an RCAin relative equal distribution     The pt denies CP  Breathing is OK   Walks 3 miles couple times per week  45 min  Diet: Break:   Eggs and protein saugage   Protein waffle   Fruit  Black coffee Lunch:   Vegetable with chicken   Water or iced tea  Unsweet Dinner:   Similar to lunch No snacks   Rare roasted peanut or chips       Current Meds  Medication Sig   Continuous Blood Gluc Sensor (FREESTYLE LIBRE 3 SENSOR) MISC 2 each by Does not apply route every 14 (fourteen) days. Place 1 sensor on the skin every 14 days. Use to check glucose continuously   emtricitabine-tenofovir (TRUVADA) 200-300 MG tablet Take 1 tablet by mouth daily.   metFORMIN (GLUCOPHAGE) 1000 MG tablet Take 1 tablet (1,000 mg total) by mouth 2 (two) times daily with a meal.   rosuvastatin (CRESTOR) 20 MG tablet Take 1 tablet (20 mg total) by mouth daily.   Semaglutide, 2 MG/DOSE, (OZEMPIC, 2 MG/DOSE,) 8 MG/3ML SOPN Inject 2 mg into the skin once a week.     Allergies:   Kombiglyze xr [saxagliptin-metformin er]   Past Medical History:  Diagnosis Date   Diabetes mellitus without complication ([place])    3329 out of control sugars, diet controlled 2016-2020   Empyema lung ([place]) 02/2014   VATS surgery   Family history of premature CAD    Family history of prostate cancer    Hyperlipidemia    Myopia of both eyes 10/09/2017    Past Surgical History:  Procedure Laterality Date   DECORTICATION Right 03/12/2014   Procedure: DECORTICATION;   Surgeon: Melrose Nakayama, MD;  Location: [facility];  Service: Thoracic;  Laterality: Right;   lung drained     VIDEO ASSISTED THORACOSCOPY (VATS)/EMPYEMA Right 03/12/2014   Procedure: VIDEO ASSISTED THORACOSCOPY (VATS)/EMPYEMA;  Surgeon: Melrose Nakayama, MD;  Location: [facility];  Service: Thoracic;  Laterality: Right;     Social History:  The patient  reports that he has never smoked. He has never used smokeless tobacco. He reports current alcohol use. He reports that he does not use drugs.   Family History:  The patient's family history includes Arthritis in his sister; COPD in his mother; Cancer in his maternal grandmother and mother; Cancer (age of onset: 61) in his father; Diabetes in his brother; Heart disease in his mother; Heart disease (age of onset: 63) in his father; Hypertension in his father and mother; Kidney disease in his father.    ROS:  Please see the history of present illness. All other systems are reviewed and  Negative to the above problem except as noted.    PHYSICAL EXAM: VS:  BP (!) 140/94   Pulse (!) 105   Ht 5\' 5"  (1.651 m)   Wt 205 lb  6.4 oz (93.2 kg)   SpO2 96%   BMI 34.18 kg/m   GEN: Obese 52 yo  in no acute distress  HEENT: normal  Neck: no JVD, carotid bruits, Cardiac: RRR; no murmurs, No LE edema  Respiratory:  clear to auscultation bilaterally GI: soft, nontender, nondistended, + BS  No hepatomegaly  MS: no deformity Moving all extremities   Skin: warm and dry, no rash Neuro:  Strength and sensation are intact Psych: euthymic mood, full affect   EKG:  EKG is ordered today.  Sinus tachycardia  105 bpm   LVH       Lipid Panel    Component Value Date/Time   CHOL 129 10/23/2022 1212   TRIG 88 10/23/2022 1212   HDL 35 (L) 10/23/2022 1212   CHOLHDL 3.7 10/23/2022 1212   CHOLHDL 2.3 05/07/2016 1439   VLDL 8 05/07/2016 1439   LDLCALC 77 10/23/2022 1212      Wt Readings from Last 3 Encounters:  01/15/23 205 lb 6.4 oz (93.2 kg)  10/23/22  204 lb (92.5 kg)  07/04/22 212 lb 12.8 oz (96.5 kg)      ASSESSMENT AND PLAN:  1  CAD  pt with high Ca score on CT    (726)   He is asymptomatic   I would recomm a PET CT to evaluate perfusion, blood flow reserve May consider echo at some pt    2.  HL  On Crestor  Needs tighter control   Increae to 40 mg per day     3  DM   reviewed diet   Need tighter control      4  Activity   increase activity        Current medicines are reviewed at length with the patient today.  The patient does not have concerns regarding medicines.  Signed, Dorris Carnes, MD  01/15/2023 3:22 PM    [facility] [place], [place], [place]  [address] Phone: [phone]; Fax: [phone]

## 2023-01-17 ENCOUNTER — Other Ambulatory Visit: Payer: Self-pay | Admitting: Medical

## 2023-01-19 NOTE — Telephone Encounter (Signed)
Has an appt in March 

## 2023-01-30 ENCOUNTER — Telehealth: Payer: Self-pay

## 2023-01-30 ENCOUNTER — Telehealth: Payer: Self-pay | Admitting: Internal Medicine

## 2023-01-30 NOTE — Telephone Encounter (Signed)
-----   Message from Fay Records, MD sent at 01/22/2023  9:42 PM EST ----- Regarding: RE: AUTH DENIAL Please make sure pt has follow up to see me after labs drawn   ----- Message ----- From: Sharol Harness Sent: 01/21/2023  10:48 AM EST To: Lorenza Evangelist, RN; Fay Records, MD; # Subject: Dean Wallace                                    Good morning,   [employer] has denied pt auth for Pet scan. I've faxed an appeal with EKG results and will keep you posted on decision.  Following are the details of denial:   The reason for our determination is: Your doctor told us that there is a concern related to the  blood vessels in your heart. It must be needed because there is documentation that you have  one of the following. -Severe obesity (such as a body mass index of 40 or higher).  -Large breasts or implants.  -Inability to exercise enough to reach a target heart rate due to a physical problem. You must have one of the following.  -New, recurrent, or worsening signs or symptoms suggestive of decreased oxygen to your heart  muscle (cardiac ischemia) along with a finding on a tracing of your heart's electrical activity  (electrocardiogram or ECG) that could make it hard to tell if your heart is getting enough blood  supply with exercise.  -A finding on an ECG that could make it hard to tell if your heart is getting enough blood supply  with exercise that is either new or has never been evaluated.  -Other indication listed in this guideline.  This finding was based on review of [employer] Cardiac Imaging Guidelines Section(s):  Stress Testing with Imaging - Indications (CD 1.4)  Thanks,  Anisah

## 2023-01-30 NOTE — Telephone Encounter (Signed)
Appt made with Dr Harrington Challenger 03/17/23.

## 2023-01-30 NOTE — Telephone Encounter (Signed)
-----   Message from Fay Records, MD sent at 01/22/2023  9:42 PM EST ----- Regarding: RE: AUTH DENIAL Please make sure pt has follow up to see me after labs drawn   ----- Message ----- From: Sharol Harness Sent: 01/21/2023  10:48 AM EST To: Lorenza Evangelist, RN; Fay Records, MD; # Subject: Theophilus Bones                                    Good morning,   [employer] has denied pt auth for Pet scan. I've faxed an appeal with EKG results and will keep you posted on decision.  Following are the details of denial:   The reason for our determination is: Your doctor told us that there is a concern related to the  blood vessels in your heart. It must be needed because there is documentation that you have  one of the following. -Severe obesity (such as a body mass index of 40 or higher).  -Large breasts or implants.  -Inability to exercise enough to reach a target heart rate due to a physical problem. You must have one of the following.  -New, recurrent, or worsening signs or symptoms suggestive of decreased oxygen to your heart  muscle (cardiac ischemia) along with a finding on a tracing of your heart's electrical activity  (electrocardiogram or ECG) that could make it hard to tell if your heart is getting enough blood  supply with exercise.  -A finding on an ECG that could make it hard to tell if your heart is getting enough blood supply  with exercise that is either new or has never been evaluated.  -Other indication listed in this guideline.  This finding was based on review of [employer] Cardiac Imaging Guidelines Section(s):  Stress Testing with Imaging - Indications (CD 1.4)  Thanks,  Anisah

## 2023-01-30 NOTE — Telephone Encounter (Signed)
Becky with [employer] called and states pt was denied CT heart due to Dx of DM. You can call her @ [phone] or call for a peer to peer at [phone]

## 2023-01-30 NOTE — Telephone Encounter (Signed)
Pt having labs 03/12/23 and seeing Dr Harrington Challenger 03/17/23... for his Cardiac PET:   Becky with [facility] called and states pt was denied CT heart due to Dx of DM. You can call her @ [phone] or call for a peer to peer at [phone]

## 2023-01-30 NOTE — Telephone Encounter (Signed)
Ann- We got a call from [facility] that CT was not covered. I am assuming this is for the imaging Dr. Harrington Challenger is requesting.

## 2023-02-05 NOTE — Telephone Encounter (Signed)
I think the wrong test was ordered  I want a Lexiscan PET/CT of heart    He has high calcium score   Want to evaluate for ischemia, assure that he has a normal flow reserve with stress

## 2023-02-11 NOTE — Telephone Encounter (Signed)
Dah, [place] - 01/30/2023  9:11 AM Lorenza Evangelist, RN  Sent: Tue February 10, 2023  1:27 PM  To: Stephani Police, RN; Fay Records, MD; Abdul-Razzaaq, [place] team,  I'm looping in [place] with pre-cert who helps with our cardiac PET stress PAs.    Thank you,  Marchia Bond RN Navigator Cardiac [facility]  [phone] Office  [phone] Cell

## 2023-02-20 ENCOUNTER — Ambulatory Visit (INDEPENDENT_AMBULATORY_CARE_PROVIDER_SITE_OTHER): Payer: 59 | Admitting: Medical

## 2023-02-20 VITALS — BP 110/70 | HR 106 | Wt 199.0 lb

## 2023-02-20 DIAGNOSIS — E1165 Type 2 diabetes mellitus with hyperglycemia: Secondary | ICD-10-CM

## 2023-02-20 DIAGNOSIS — Z79899 Other long term (current) drug therapy: Secondary | ICD-10-CM

## 2023-02-20 DIAGNOSIS — E785 Hyperlipidemia, unspecified: Secondary | ICD-10-CM

## 2023-02-20 DIAGNOSIS — E1169 Type 2 diabetes mellitus with other specified complication: Secondary | ICD-10-CM

## 2023-02-20 DIAGNOSIS — Z299 Encounter for prophylactic measures, unspecified: Secondary | ICD-10-CM | POA: Diagnosis not present

## 2023-02-20 DIAGNOSIS — I2510 Atherosclerotic heart disease of native coronary artery without angina pectoris: Secondary | ICD-10-CM

## 2023-02-20 LAB — POCT GLYCOSYLATED HEMOGLOBIN (HGB A1C): Hemoglobin A1C: 8.4 % — AB (ref 4.0–5.6)

## 2023-02-20 NOTE — Progress Notes (Signed)
Subjective:  Dean Wallace is a 52 y.o. male who presents for Chief Complaint  Patient presents with   diabetes check    Diabetes check, no other concerns     Here for med check/chronic disease follow up  Diabetes - has been working to cut down on carbs, particularly breads.  Wasn't doing a lot of fried foods, but has cut back even more.  Trying to use more fresh fruits and vegetables.  Is down 5lb on weight since last visit.  Compliant with metformin and ozempic.  He feels like the last couple weeks his sugars have been stable and mostly under 130.  Hyperlipidemia - recently cardiology increased his crestor to '40mg'$ .    Prep- compliant with truvada.   No symptoms of concern, but does get some abdominal discomfort occasionally.  No sexual activity since last visit.  CT coronary test in January 2024 showed cerebral arteries with atherosclerosis.  He saw cardiology and they recommended a stress test.  He is having some issues with insurance trying to approve the test  He is past due for eye exam and plans to schedule this soon  No other aggravating or relieving factors.    No other c/o.  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 Swallow whole. 100 tablet 3   Continuous Blood Gluc Sensor (FREESTYLE LIBRE 3 SENSOR) MISC 2 each by Does not apply route every 14 (fourteen) days. Place 1 sensor on the skin every 14 days. Use to check glucose continuously 2 each 11   emtricitabine-tenofovir (TRUVADA) 200-300 MG tablet TAKE 1 TABLET BY MOUTH DAILY 90 tablet 0   metFORMIN  (GLUCOPHAGE) 1000 MG tablet Take 1 tablet (1,000 mg total) by mouth 2 (two) times daily with a meal. 180 tablet 1   rosuvastatin (CRESTOR) 40 MG tablet Take 1 tablet (40 mg total) by mouth daily. 90 tablet 3   Semaglutide, 2 MG/DOSE, (OZEMPIC, 2 MG/DOSE,) 8 MG/3ML SOPN Inject 2 mg into the skin once a week. 3 mL 5   No current facility-administered medications on file prior to visit.    The following portions of the patient's history were reviewed and updated as appropriate: allergies, current medications, past family history, past medical history, past social history, past surgical history and problem list.  ROS Otherwise as in subjective above    Objective: BP 110/70   Pulse (!) 106   Wt 199 lb (90.3 kg)   BMI 33.12 kg/m   General appearance: alert, no distress, well developed, well nourished Neck: supple, no lymphadenopathy, no thyromegaly, no masses Heart: RRR, normal S1, S2, no murmurs Lungs: CTA bilaterally, no wheezes, rhonchi, or rales Pulses: 2+ radial pulses, 2+ pedal pulses, normal cap refill Ext: no edema  Diabetic Foot Exam - Simple   Simple Foot Form Diabetic Foot exam was performed with the following findings: Yes 02/20/2023  9:46 AM  Visual Inspection No deformities, no ulcerations, no other skin breakdown bilaterally: Yes Sensation Testing Intact to touch and monofilament testing bilaterally: Yes Pulse  Check Posterior Tibialis and Dorsalis pulse intact bilaterally: Yes Comments       Assessment: Encounter Diagnoses  Name Primary?   Type 2 diabetes mellitus with hyperglycemia, without long-term current use of insulin (HCC) Yes   Hyperlipidemia associated with type 2 diabetes mellitus (HCC)    High risk medication use    Prophylactic measure    Coronary artery disease involving native coronary artery of native heart without angina pectoris      Plan: Diabetes type 2 Continue metformin 1000 mg twice daily, Ozempic 2 mg daily.  He has had some recent  abdominal symptoms related to Ozempic.  We will hold the same dose for now.  Although his hemoglobin A1c was 8% today he feels like the last few weeks has been stable under 130 fasting morning sugar readings.  I reviewed his freestyle libre view report.  He has been 72% in the target range, no recent lows, and based on his report and his recollection he is doing better with diet and exercise and sugars are coming down.  He does not want to change medicine at this time.  We did discuss possibly SGLT2 in the future.  If he continues to have some problems with Ozempic with GI side effects we may change to [facility]  Hyperlipidemia-continue statin, follow-up with cardiology as planned end of March for labs fasting  CAD-I reviewed his recent cardiology notes.  They are planning a stress test.  His Crestor was recently increased.  He has follow-up with cardiology at the end of this month for fasting labs and lipid profile.  Aspirin was added as well.  Prophylactic measure, prep-no labs today as he has had no recent sexual activity.  He does continue on Truvada prep   Sylvesta was seen today for diabetes check.  Diagnoses and all orders for this visit:  Type 2 diabetes mellitus with hyperglycemia, without long-term current use of insulin (HCC) -     HgB A1c  Hyperlipidemia associated with type 2 diabetes mellitus (HCC)  High risk medication use  Prophylactic measure  Coronary artery disease involving native coronary artery of native heart without angina pectoris   Follow up: 41mo

## 2023-03-02 ENCOUNTER — Other Ambulatory Visit: Payer: Self-pay | Admitting: Cardiovascular Disease

## 2023-03-02 ENCOUNTER — Other Ambulatory Visit: Payer: Self-pay

## 2023-03-02 DIAGNOSIS — E1169 Type 2 diabetes mellitus with other specified complication: Secondary | ICD-10-CM

## 2023-03-02 DIAGNOSIS — Z8249 Family history of ischemic heart disease and other diseases of the circulatory system: Secondary | ICD-10-CM

## 2023-03-02 DIAGNOSIS — R079 Chest pain, unspecified: Secondary | ICD-10-CM

## 2023-03-02 DIAGNOSIS — E1165 Type 2 diabetes mellitus with hyperglycemia: Secondary | ICD-10-CM

## 2023-03-02 NOTE — Telephone Encounter (Signed)
Message sent to the [facility] Pet Scan pool for follow up.

## 2023-03-05 ENCOUNTER — Other Ambulatory Visit: Payer: Self-pay

## 2023-03-05 DIAGNOSIS — Z8249 Family history of ischemic heart disease and other diseases of the circulatory system: Secondary | ICD-10-CM

## 2023-03-05 DIAGNOSIS — Z0000 Encounter for general adult medical examination without abnormal findings: Secondary | ICD-10-CM

## 2023-03-05 DIAGNOSIS — Z79899 Other long term (current) drug therapy: Secondary | ICD-10-CM

## 2023-03-05 DIAGNOSIS — E1165 Type 2 diabetes mellitus with hyperglycemia: Secondary | ICD-10-CM

## 2023-03-05 DIAGNOSIS — E1169 Type 2 diabetes mellitus with other specified complication: Secondary | ICD-10-CM

## 2023-03-09 ENCOUNTER — Other Ambulatory Visit: Payer: Self-pay | Admitting: Medical

## 2023-03-12 ENCOUNTER — Ambulatory Visit: Payer: 59 | Attending: Internal Medicine

## 2023-03-12 DIAGNOSIS — E1169 Type 2 diabetes mellitus with other specified complication: Secondary | ICD-10-CM

## 2023-03-12 DIAGNOSIS — Z0000 Encounter for general adult medical examination without abnormal findings: Secondary | ICD-10-CM

## 2023-03-12 DIAGNOSIS — E1165 Type 2 diabetes mellitus with hyperglycemia: Secondary | ICD-10-CM

## 2023-03-12 DIAGNOSIS — Z79899 Other long term (current) drug therapy: Secondary | ICD-10-CM

## 2023-03-12 DIAGNOSIS — Z8249 Family history of ischemic heart disease and other diseases of the circulatory system: Secondary | ICD-10-CM

## 2023-03-14 LAB — HEPATIC FUNCTION PANEL
ALT: 29 IU/L (ref 0–44)
AST: 18 IU/L (ref 0–40)
Albumin: 4.6 g/dL (ref 4.1–5.1)
Alkaline Phosphatase: 85 IU/L (ref 44–121)
Bilirubin Total: 0.4 mg/dL (ref 0.0–1.2)
Bilirubin, Direct: 0.12 mg/dL (ref 0.00–0.40)
Total Protein: 7.2 g/dL (ref 6.0–8.5)

## 2023-03-14 LAB — CBC
Hematocrit: 47.7 % (ref 37.5–51.0)
Hemoglobin: 16.4 g/dL (ref 13.0–17.7)
MCH: 32.5 pg (ref 26.6–33.0)
MCHC: 34.4 g/dL (ref 31.5–35.7)
MCV: 95 fL (ref 79–97)
Platelets: 331 10*3/uL (ref 150–450)
RBC: 5.04 x10E6/uL (ref 4.14–5.80)
RDW: 12.3 % (ref 11.6–15.4)
WBC: 11.6 10*3/uL — ABNORMAL HIGH (ref 3.4–10.8)

## 2023-03-14 LAB — NMR, LIPOPROFILE
Cholesterol, Total: 111 mg/dL (ref 100–199)
HDL Particle Number: 26.5 umol/L — ABNORMAL LOW (ref >=30.5)
HDL-C: 37 mg/dL — ABNORMAL LOW (ref >39)
LDL Particle Number: 825 nmol/L (ref <1000)
LDL Size: 20.4 nm — ABNORMAL LOW (ref >20.5)
LDL-C (NIH Calc): 52 mg/dL (ref 0–99)
LP-IR Score: 59 — ABNORMAL HIGH (ref <=45)
Small LDL Particle Number: 429 nmol/L (ref <=527)
Triglycerides: 124 mg/dL (ref 0–149)

## 2023-03-14 LAB — APOLIPOPROTEIN B: Apolipoprotein B: 63 mg/dL (ref <90)

## 2023-03-14 LAB — HEMOGLOBIN A1C
Est. average glucose Bld gHb Est-mCnc: 174 mg/dL
Hgb A1c MFr Bld: 7.7 % — ABNORMAL HIGH (ref 4.8–5.6)

## 2023-03-14 LAB — LIPOPROTEIN A (LPA): Lipoprotein (a): 49.6 nmol/L (ref <75.0)

## 2023-03-15 NOTE — Progress Notes (Unsigned)
Cardiology Office Note   Date:  03/17/2023   ID:  Dean Wallace, DOB Sep 24, 1971, MRN [id number]  PCP:  Carlena Hurl, PA-C  Cardiologist:   Dorris Carnes, MD   Patient presents for follow up of CAD      History of Present Illness: Dean Wallace is a 52 y.o. male with a history of DM (Dx in 2015), HL (started statin in 2018)     Recently had a Ca score  CT done  His Ca score was   726 (99%ile for age, sex0   Calcifications present in LAD, LCx an RCAin relative equal distribution    I saw the pt in early Feb 2024   PET/ CT ordered to r/o ischemia, evaluate flow reserve given high Ca score and hx of diabetes that has not been well controlled in the past   Still working on insurance approval  Since I saw him he has had another clinic appt   At that visit  BP 110/70    Since seen the pt denies CP    He says his breathing is OK    He walks 2 to 3 x per week    About 1.5 to 2 miles     Current Meds  Medication Sig   aspirin EC 81 MG tablet Take 1 tablet (81 mg total) by mouth daily. Swallow whole.   Continuous Blood Gluc Sensor (FREESTYLE LIBRE 3 SENSOR) MISC 2 each by Does not apply route every 14 (fourteen) days. Place 1 sensor on the skin every 14 days. Use to check glucose continuously   emtricitabine-tenofovir (TRUVADA) 200-300 MG tablet TAKE 1 TABLET BY MOUTH DAILY   metFORMIN (GLUCOPHAGE) 1000 MG tablet TAKE 1 TABLET BY MOUTH TWICE  DAILY WITH A MEAL   rosuvastatin (CRESTOR) 40 MG tablet Take 1 tablet (40 mg total) by mouth daily.   Semaglutide, 2 MG/DOSE, (OZEMPIC, 2 MG/DOSE,) 8 MG/3ML SOPN Inject 2 mg into the skin once a week.     Allergies:   Kombiglyze xr [saxagliptin-metformin er]   Past Medical History:  Diagnosis Date   Diabetes mellitus without complication    123456 out of control sugars, diet controlled 2016-2020   Empyema lung 02/2014   VATS surgery   Family history of premature CAD    Family history of prostate cancer    Hyperlipidemia    Myopia of both eyes  10/09/2017    Past Surgical History:  Procedure Laterality Date   DECORTICATION Right 03/12/2014   Procedure: DECORTICATION;  Surgeon: Melrose Nakayama, MD;  Location: [facility];  Service: Thoracic;  Laterality: Right;   lung drained     VIDEO ASSISTED THORACOSCOPY (VATS)/EMPYEMA Right 03/12/2014   Procedure: VIDEO ASSISTED THORACOSCOPY (VATS)/EMPYEMA;  Surgeon: Melrose Nakayama, MD;  Location: [facility];  Service: Thoracic;  Laterality: Right;     Social History:  The patient  reports that he has never smoked. He has never used smokeless tobacco. He reports current alcohol use. He reports that he does not use drugs.   Family History:  The patient's family history includes Arthritis in his sister; COPD in his mother; Cancer in his maternal grandmother and mother; Cancer (age of onset: 40) in his father; Diabetes in his brother; Heart disease in his mother; Heart disease (age of onset: 29) in his father; Hypertension in his father and mother; Kidney disease in his father.    ROS:  Please see the history of present illness. All other systems are reviewed and  Negative to the above problem except as noted.    PHYSICAL EXAM: VS:  BP 118/80   Pulse 87   Ht 5\' 5"  (1.651 m)   Wt 198 lb 9.6 oz (90.1 kg)   SpO2 95%   BMI 33.05 kg/m   GEN: Obese 52 yo  in no acute distress  HEENT: normal  Neck: no JVD, carotid bruit Cardiac: RRR; no murmur  No LE edema  Respiratory:  clear to auscultation  GI: soft, nontender No hepatomegaly  MS: no deformity Moving all extremities   Skin: warm and dry, no rash    EKG:  EKG is  not ordered today.       Lipid Panel    Component Value Date/Time   CHOL 129 10/23/2022 1212   TRIG 88 10/23/2022 1212   HDL 35 (L) 10/23/2022 1212   CHOLHDL 3.7 10/23/2022 1212   CHOLHDL 2.3 05/07/2016 1439   VLDL 8 05/07/2016 1439   LDLCALC 77 10/23/2022 1212      Wt Readings from Last 3 Encounters:  03/17/23 198 lb 9.6 oz (90.1 kg)  02/20/23 199 lb (90.3 kg)   01/15/23 205 lb 6.4 oz (93.2 kg)      ASSESSMENT AND PLAN:  1  CAD  pt with high Ca score on CT    (726)   Remains asymptomatic for level of activity    I would still pursue PET/CT esp given DM and high score   2.  HL  On Crestor 40 mg   Recent check LDL 52  HDL 37  Trig 124    3  DM   reviewed diet  A1C is coming down   7.7 on recent check     4  HTN  BP controlled today    Current medicines are reviewed at length with the patient today.  The patient does not have concerns regarding medicines.  Signed, Dorris Carnes, MD  03/17/2023 9:56 AM    [address], [place], [place]  [address] Phone: [phone]; Fax: [phone]

## 2023-03-17 ENCOUNTER — Ambulatory Visit: Payer: 59 | Attending: Internal Medicine | Admitting: Internal Medicine

## 2023-03-17 ENCOUNTER — Encounter: Payer: Self-pay | Admitting: Internal Medicine

## 2023-03-17 VITALS — BP 118/80 | HR 87 | Ht 65.0 in | Wt 198.6 lb

## 2023-03-17 DIAGNOSIS — I2510 Atherosclerotic heart disease of native coronary artery without angina pectoris: Secondary | ICD-10-CM | POA: Diagnosis not present

## 2023-03-17 NOTE — Patient Instructions (Signed)
Medication Instructions:   *If you need a refill on your cardiac medications before your next appointment, please call your pharmacy*   Lab Work:  If you have labs (blood work) drawn today and your tests are completely normal, you will receive your results only by: MyChart Message (if you have MyChart) OR A paper copy in the mail If you have any lab test that is abnormal or we need to change your treatment, we will call you to review the results.   Testing/Procedures:    Follow-Up: At [facility], you and your health needs are our priority.  As part of our continuing mission to provide you with exceptional heart care, we have created designated Provider Care Teams.  These Care Teams include your primary Cardiologist (physician) and Advanced Practice Providers (APPs -  Physician Assistants and Nurse Practitioners) who all work together to provide you with the care you need, when you need it.  We recommend signing up for the patient portal called "MyChart".  Sign up information is provided on this After Visit Summary.  MyChart is used to connect with patients for Virtual Visits (Telemedicine).  Patients are able to view lab/test results, encounter notes, upcoming appointments, etc.  Non-urgent messages can be sent to your provider as well.   To learn more about what you can do with MyChart, go to [email or web address].    

## 2023-03-19 ENCOUNTER — Telehealth: Payer: Self-pay

## 2023-03-19 DIAGNOSIS — I2510 Atherosclerotic heart disease of native coronary artery without angina pectoris: Secondary | ICD-10-CM

## 2023-03-19 NOTE — Telephone Encounter (Signed)
-----   Message from Leanord Asal sent at 03/02/2023 12:33 PM EDT ----- Regarding: RE: Pet scan Ardath Sax not showing an order in EPIC for the Cardiac PET.   ----- Message ----- From: Stephani Police, RN Sent: 03/02/2023   8:17 AM EDT To: Lorenza Evangelist, RN; Sharol Harness; # Subject: Pet scan                                       Hey, sorry to bring this pt up again.Marland Kitchen but Dr Harrington Challenger asked me the status of him getting a cardiac pet scan... honestly I do not know where we are at with it.... is it odd that his insurance declined it? Did I order it wrong? She thinks the wrong test was ordered possibly?  Thank you and sorry for the added work, Sania Noy B.

## 2023-03-19 NOTE — Telephone Encounter (Signed)
I can still see the order but will place again. Staff messages sent re: helping to have this ot get his Pet scan scheduled.

## 2023-03-25 ENCOUNTER — Telehealth: Payer: Self-pay

## 2023-03-25 NOTE — Telephone Encounter (Addendum)
I called Becky with [employer] to see if I can help with the denial for the Cardiac Pet Scan [phone]  She suggests that Dr Tenny Craw do a Peer to Peer to help get it approved.. they have certain criteria for the PET Scan that Dr Tenny Craw could talk with their MD to see if she can attest to.    Peer to peer number: [phone]   I called the Peer number and they said they could not talk to me since I did not have the "correct" ordering MD... it was not Dr Dietrich Pates listed and ended the call.   I will reach back out to Abdul-Razzaaq, Anisah for further help and if the ordering MD could possibly be different when working to [employer].

## 2023-03-25 NOTE — Telephone Encounter (Signed)
Per precert: to [place] Dr Kirke Corin case number [id number].   I called peer review back to set up the call with the added information... I was advised the the medical director is reviewing the information already and has not reached the point of peer to peer and that I need to give it another day or two to recheck the status.   I called the pt and advised him that it is still process and apologized for the delay and we are continuing to wait and provide all information that the insurance [facility]) needs.

## 2023-03-26 ENCOUNTER — Other Ambulatory Visit: Payer: Self-pay | Admitting: Medical

## 2023-03-30 ENCOUNTER — Other Ambulatory Visit: Payer: Self-pay | Admitting: Medical

## 2023-04-13 NOTE — Telephone Encounter (Signed)
Checking the pts chart and multiple staff messages to follow up if the pt can get his PET Scan.. it has been an extensive amount of time.   Per Anisah with Cardiac Pet dept.... Hi Darlene Bartelt, checking now. I believe it was denied.   Unsure where to go from here... as my previous note I tried to set up a Peer to Peer but Monia Pouch told me it was not needed that a Mining engineer" was reviewing his record.   Will forward to Dr Tenny Craw after she returns to see if any other testing can be done as an alternative.

## 2023-04-13 NOTE — Telephone Encounter (Signed)
Copied from a secure chat:   Per Perlie Gold   [employer] denied it again. here are their reasons: You must have one of the following.  -Findings on a tracing of your heart's electrical activity (electrocardiogram or ECG) that could  make it hard to tell if your heart is getting enough blood supply with exercise.  -Results of an ECG and blood pressure check done before, during, and after walking on a  treadmill (exercise stress test) were unclear (inconclusive) or not normal as defined in the  guideline.  -Other indication as listed in this guideline. You must have one of the following new, recurring, or worsening symptoms.  -Chest pain.  -Shortness of breath brought on by exercise (exertional dyspnea).  -Feeling overtired after activity (exertional fatigue).   Your provider can discuss this case by calling the [employer] Clinical Request Line at: [phone]. ? If your provider asks for a peer-to-peer review, and the request for coverage is still  denied, you can ask for an appeal.  AA Episode ID - [id number]/ Original CASE# [id number]   This is listed:  CAD screening, high CAD risk, not treadmill candidate  Dx: Coronary artery disease involving native coronary artery of native heart without angina pectoris [I25.10 (ICD-10-CM)]

## 2023-05-18 ENCOUNTER — Other Ambulatory Visit: Payer: Self-pay | Admitting: Medical

## 2023-06-26 ENCOUNTER — Ambulatory Visit (INDEPENDENT_AMBULATORY_CARE_PROVIDER_SITE_OTHER): Payer: 59 | Admitting: Medical

## 2023-06-26 VITALS — BP 106/66 | HR 94 | Wt 192.4 lb

## 2023-06-26 DIAGNOSIS — E785 Hyperlipidemia, unspecified: Secondary | ICD-10-CM

## 2023-06-26 DIAGNOSIS — Z7185 Encounter for immunization safety counseling: Secondary | ICD-10-CM | POA: Diagnosis not present

## 2023-06-26 DIAGNOSIS — Z299 Encounter for prophylactic measures, unspecified: Secondary | ICD-10-CM | POA: Diagnosis not present

## 2023-06-26 DIAGNOSIS — E1169 Type 2 diabetes mellitus with other specified complication: Secondary | ICD-10-CM

## 2023-06-26 DIAGNOSIS — Z113 Encounter for screening for infections with a predominantly sexual mode of transmission: Secondary | ICD-10-CM

## 2023-06-26 DIAGNOSIS — Z79899 Other long term (current) drug therapy: Secondary | ICD-10-CM

## 2023-06-26 DIAGNOSIS — E1165 Type 2 diabetes mellitus with hyperglycemia: Secondary | ICD-10-CM | POA: Diagnosis not present

## 2023-06-26 DIAGNOSIS — I2510 Atherosclerotic heart disease of native coronary artery without angina pectoris: Secondary | ICD-10-CM

## 2023-06-26 LAB — COMPREHENSIVE METABOLIC PANEL
ALT: 18 IU/L (ref 0–44)
Alkaline Phosphatase: 78 IU/L (ref 44–121)
BUN/Creatinine Ratio: 19 (ref 9–20)
BUN: 14 mg/dL (ref 6–24)
Creatinine, Ser: 0.75 mg/dL — ABNORMAL LOW (ref 0.76–1.27)
Glucose: 164 mg/dL — ABNORMAL HIGH (ref 70–99)
Sodium: 141 mmol/L (ref 134–144)
Total Protein: 6.4 g/dL (ref 6.0–8.5)
eGFR: 110 mL/min/{1.73_m2} (ref >59)

## 2023-06-26 LAB — MICROALBUMIN / CREATININE URINE RATIO

## 2023-06-26 NOTE — Progress Notes (Signed)
Subjective:  Dean Wallace is a 52 y.o. male who presents for Chief Complaint  Patient presents with   diabetes    Diabetes, no other concerns due for eye exam     Here for chronic disease follow-up.  Diabetes that is compliant with Ozempic 2 mg weekly, metformin 1000 mg twice daily without side effect.  Blood sugars run anywhere from normal to sometimes will spike up to even close to 200 but in the last few months no major spikes on a regular basis.  Overall doing relatively okay.  Exercising with walking.  Eating fairly healthy.  Not drinking any sugary drinks.  No symptoms of concern.  Hyperlipidemia-compliant with aspirin 81 mg daily and Crestor rosuvastatin 40 mg daily without complaint  He is on prophylaxis with Truvada prep.  No symptoms of concern.  No new partners.  Here for routine lab testing.  No other aggravating or relieving factors.    No other c/o.  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   VATS surgery   Family history of premature CAD    Family history of prostate cancer    Hyperlipidemia    Myopia of both eyes 10/09/2017   Current Outpatient Medications on File Prior to Visit  Medication Sig Dispense Refill   aspirin EC 81 MG tablet Take 1 tablet (81 mg total) by mouth daily. Swallow whole. 100 tablet 3   Continuous Blood Gluc Sensor (FREESTYLE LIBRE 3 SENSOR) MISC 2 each by Does not apply route every 14 (fourteen) days. Place 1 sensor on the skin every 14 days. Use to check glucose continuously 2 each 11   emtricitabine-tenofovir (TRUVADA) 200-300 MG tablet TAKE 1 TABLET BY MOUTH DAILY 90 tablet 0   metFORMIN (GLUCOPHAGE) 1000 MG tablet TAKE 1 TABLET BY MOUTH TWICE  DAILY WITH A MEAL 180 tablet 0   OZEMPIC, 2 MG/DOSE, 8 MG/3ML SOPN INJECT SUBCUTANEOUSLY 2 MG EVERY WEEK 9 mL 3   rosuvastatin (CRESTOR) 40 MG tablet Take 1 tablet (40  mg total) by mouth daily. 90 tablet 3   No current facility-administered medications on file prior to visit.     The following portions of the patient's history were reviewed and updated as appropriate: allergies, current medications, past family history, past medical history, past social history, past surgical history and problem list.  ROS Otherwise as in subjective above  Objective: BP 106/66   Pulse 94   Wt 192 lb 6.4 oz (87.3 kg)   BMI 32.02 kg/m   General appearance: alert, no distress, well developed, well nourished Neck: supple, no lymphadenopathy, no thyromegaly, no masses, no bruits Heart: RRR, normal S1, S2, no murmurs Lungs: CTA bilaterally, no wheezes, rhonchi, or rales Pulses: 2+ radial pulses, 2+ pedal pulses, normal cap refill Ext: no edema  Diabetic Foot Exam - Simple   Simple Foot Form Diabetic Foot exam was performed with the following findings: Yes 06/26/2023 10:49 AM  Visual Inspection Sensation Testing Intact to touch and monofilament testing bilaterally: Yes Pulse Check Posterior Tibialis and Dorsalis pulse intact bilaterally: Yes Comments Conjoined second and third toes bilaterally      Assessment: Encounter Diagnoses  Name Primary?   Type 2 diabetes mellitus with hyperglycemia, without long-term current use of insulin (HCC) Yes   Vaccine counseling    Screen for STD (sexually transmitted disease)    Prophylactic measure  Hyperlipidemia associated with type 2 diabetes mellitus (HCC)    High risk medication use    Coronary artery disease involving native coronary artery of native heart without angina pectoris      Plan: Diabetes-updated labs today.  Continue metformin 1000 mg twice daily, Ozempic 2 mg daily.  Continue glucose monitoring.  Continue with healthy eating habits and regular exercise.  Hyperlipidemia-continue statin and aspirin daily  History of CAD-continue statin and aspirin daily  Prophylactic measure, treatment of  current Truvada prep.  Labs today as below.  Vaccine counseling: Recommend shingles vaccine.  I recommend you see your eye doctor soon and have your diabetic yearly eye exam.  [name] was seen today for diabetes.  Diagnoses and all orders for this visit:  Type 2 diabetes mellitus with hyperglycemia, without long-term current use of insulin (HCC) -     Microalbumin/Creatinine Ratio, Urine -     Hemoglobin A1c -     Comprehensive metabolic panel  Vaccine counseling  Screen for STD (sexually transmitted disease) -     HIV Antibody (routine testing w rflx)  Prophylactic measure -     HIV Antibody (routine testing w rflx)  Hyperlipidemia associated with type 2 diabetes mellitus (HCC)  High risk medication use  Coronary artery disease involving native coronary artery of native heart without angina pectoris    Follow up: pending labs

## 2023-06-27 LAB — COMPREHENSIVE METABOLIC PANEL
AST: 13 IU/L (ref 0–40)
Albumin: 4.2 g/dL (ref 4.1–5.1)
Bilirubin Total: 0.3 mg/dL (ref 0.0–1.2)
CO2: 22 mmol/L (ref 20–29)
Calcium: 9.6 mg/dL (ref 8.7–10.2)
Chloride: 105 mmol/L (ref 96–106)
Globulin, Total: 2.2 g/dL (ref 1.5–4.5)
Potassium: 4.5 mmol/L (ref 3.5–5.2)

## 2023-06-27 LAB — HEMOGLOBIN A1C
Est. average glucose Bld gHb Est-mCnc: 154 mg/dL
Hgb A1c MFr Bld: 7 % — ABNORMAL HIGH (ref 4.8–5.6)

## 2023-06-27 LAB — MICROALBUMIN / CREATININE URINE RATIO: Creatinine, Urine: 138.6 mg/dL

## 2023-06-27 LAB — HIV ANTIBODY (ROUTINE TESTING W REFLEX): HIV Screen 4th Generation wRfx: NONREACTIVE

## 2023-06-29 ENCOUNTER — Other Ambulatory Visit: Payer: Self-pay | Admitting: Medical

## 2023-06-29 MED ORDER — EMTRICITABINE-TENOFOVIR DF 200-300 MG PO TABS: 1.0000 | ORAL_TABLET | Freq: Every day | ORAL | 1 refills | Status: DC

## 2023-06-29 MED ORDER — OZEMPIC (2 MG/DOSE) 8 MG/3ML ~~LOC~~ SOPN: 2.0000 mg | PEN_INJECTOR | SUBCUTANEOUS | 2 refills | Status: DC

## 2023-06-29 MED ORDER — METFORMIN HCL 1000 MG PO TABS: 1000.0000 mg | ORAL_TABLET | Freq: Two times a day (BID) | ORAL | 3 refills | Status: DC

## 2023-06-29 NOTE — Progress Notes (Signed)
 Results sent through MyChart

## 2023-08-07 ENCOUNTER — Other Ambulatory Visit: Payer: Self-pay | Admitting: Medical

## 2023-08-07 NOTE — Telephone Encounter (Signed)
Last apt 06/26/23.

## 2023-09-03 NOTE — Telephone Encounter (Signed)
PET /CT never got approved    Set him up for follow up appt

## 2023-09-04 NOTE — Telephone Encounter (Signed)
Sent to the [facility] to have him scheduled this Fall.

## 2023-09-23 LAB — HM DIABETES EYE EXAM

## 2023-09-29 ENCOUNTER — Encounter: Payer: Self-pay | Admitting: Internal Medicine

## 2023-10-25 NOTE — Progress Notes (Unsigned)
Cardiology Office Note   Date:  10/27/2023   ID:  Caryn Bee Rabadan, DOB [birth year], MRN [id number]  PCP:  Jac Canavan, PA-C  Cardiologist:   Dietrich Pates, MD   Patient presents for follow up of CAD      History of Present Illness: Dean Wallace is a 52 y.o. male with a history of DM (Dx in 2015), HL (started statin in 2018)     Recently had a Ca score  CT done  His Ca score was   726 (99%ile for age, sex)  Calcifications present in all 3 coronary arteries   I saw the pt in early Feb 2024   PET/ CT ordered to r/o ischemia, evaluate flow reserve given high Ca score   Never approved by his insurance   Is saw the pt in APril 2024 Since then he denies CP  Breathing is good   He denies  dizziness    No palpitations     Walking   10 to 12,000 steps  on good days   Last night didn't sleep well  Had reflux  Vomited   Wonders if that is why his BP is up     Allergies:   Kombiglyze xr [saxagliptin-metformin er]   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   VATS surgery   Family history of premature CAD    Family history of prostate cancer    Hyperlipidemia    Myopia of both eyes 10/09/2017    Past Surgical History:  Procedure Laterality Date   DECORTICATION Right 03/12/2014   Procedure: DECORTICATION;  Surgeon: Loreli Slot, MD;  Location: [facility] OR;  Service: Thoracic;  Laterality: Right;   lung drained     VIDEO ASSISTED THORACOSCOPY (VATS)/EMPYEMA Right 03/12/2014   Procedure: VIDEO ASSISTED THORACOSCOPY (VATS)/EMPYEMA;  Surgeon: Loreli Slot, MD;  Location: [facility] OR;  Service: Thoracic;  Laterality: Right;     Social History:  The patient  reports that he has never smoked. He has never used smokeless tobacco. He reports current alcohol use. He reports that he does not use drugs.   Family History:  The patient's family history includes  Arthritis in his sister; COPD in his mother; Cancer in his maternal grandmother and mother; Cancer (age of onset: 86) in his father; Diabetes in his brother; Heart disease in his mother; Heart disease (age of onset: 10) in his father; Hypertension in his father and mother; Kidney disease in his father.    ROS:  Please see the history of present illness. All other systems are reviewed and  Negative to the above problem except as noted.    PHYSICAL EXAM: VS:  BP (!) 140/96 (BP Location: Right Arm, Patient Position: Sitting, Cuff Size: Normal)   Pulse 83   Ht 5\' 5"  (1.651 m)   Wt 207 lb 3.2 oz (94 kg)   SpO2 95%   BMI 34.48 kg/m   GEN: Obese 52 yo  in no acute distress  HEENT: normal  Neck: no JVD, no carotid bruits Cardiac: RRR; no murmur  No LE edema  Respiratory:  clear to auscultation  GI: soft, nontender No hepatomegaly    EKG:  EKG is  not ordered today.       Lipid Panel    Component Value Date/Time   CHOL 129 10/23/2022 1212   TRIG 88 10/23/2022 1212  HDL 35 (L) 10/23/2022 1212   CHOLHDL 3.7 10/23/2022 1212   CHOLHDL 2.3 05/07/2016 1439   VLDL 8 05/07/2016 1439   LDLCALC 77 10/23/2022 1212      Wt Readings from Last 3 Encounters:  10/27/23 207 lb 3.2 oz (94 kg)  06/26/23 192 lb 6.4 oz (87.3 kg)  03/17/23 198 lb 9.6 oz (90.1 kg)      ASSESSMENT AND PLAN:  1  CAD  pt with high Ca score on CT    (726)   Remains asymptomatic for level of activity   PET/CT was not approved by his insurance   Given that he is active and doing well  I would continue to follow clinically   2. HTN  BP is high today    It has been betterat other timesHL  Continue  Crestor 40 mg   Recent check LDL 52  HDL 37  Trig 124    3  DM  A1C contiinues to improve 7 in July   Keep working on diet   Keep active       Current medicines are reviewed at length with the patient today.  The patient does not have concerns regarding medicines.  Signed, Dietrich Pates, MD  10/27/2023 2:01 PM    [facility]  [facility] [address] [place], [place], [place]  [address] Phone: [phone]; Fax: [phone]

## 2023-10-27 ENCOUNTER — Encounter: Payer: Self-pay | Admitting: Internal Medicine

## 2023-10-27 ENCOUNTER — Ambulatory Visit: Payer: 59 | Attending: Internal Medicine | Admitting: Internal Medicine

## 2023-10-27 VITALS — BP 140/96 | HR 83 | Ht 65.0 in | Wt 207.2 lb

## 2023-10-27 DIAGNOSIS — I2510 Atherosclerotic heart disease of native coronary artery without angina pectoris: Secondary | ICD-10-CM

## 2023-10-27 NOTE — Patient Instructions (Signed)
Medication Instructions:   *If you need a refill on your cardiac medications before your next appointment, please call your pharmacy*   Lab Work:  If you have labs (blood work) drawn today and your tests are completely normal, you will receive your results only by: MyChart Message (if you have MyChart) OR A paper copy in the mail If you have any lab test that is abnormal or we need to change your treatment, we will call you to review the results.   Testing/Procedures:    Follow-Up: At [facility], you and your health needs are our priority.  As part of our continuing mission to provide you with exceptional heart care, we have created designated Provider Care Teams.  These Care Teams include your primary Cardiologist (physician) and Advanced Practice Providers (APPs -  Physician Assistants and Nurse Practitioners) who all work together to provide you with the care you need, when you need it.  We recommend signing up for the patient portal called "MyChart".  Sign up information is provided on this After Visit Summary.  MyChart is used to connect with patients for Virtual Visits (Telemedicine).  Patients are able to view lab/test results, encounter notes, upcoming appointments, etc.  Non-urgent messages can be sent to your provider as well.   To learn more about what you can do with MyChart, go to [email or web address].    Your next appointment:  END OF AUG 2025

## 2023-10-28 ENCOUNTER — Other Ambulatory Visit: Payer: Self-pay | Admitting: Medical

## 2023-11-04 ENCOUNTER — Other Ambulatory Visit: Payer: Self-pay | Admitting: Internal Medicine

## 2024-04-08 ENCOUNTER — Other Ambulatory Visit: Payer: Self-pay | Admitting: Medical

## 2024-04-13 ENCOUNTER — Other Ambulatory Visit: Payer: Self-pay | Admitting: Medical

## 2024-05-05 ENCOUNTER — Ambulatory Visit: Admitting: Medical

## 2024-05-17 ENCOUNTER — Ambulatory Visit (INDEPENDENT_AMBULATORY_CARE_PROVIDER_SITE_OTHER): Admitting: Medical

## 2024-05-17 ENCOUNTER — Encounter: Payer: Self-pay | Admitting: Medical

## 2024-05-17 VITALS — BP 122/82 | HR 91 | Ht 64.25 in | Wt 203.6 lb

## 2024-05-17 DIAGNOSIS — Z113 Encounter for screening for infections with a predominantly sexual mode of transmission: Secondary | ICD-10-CM

## 2024-05-17 DIAGNOSIS — Z125 Encounter for screening for malignant neoplasm of prostate: Secondary | ICD-10-CM | POA: Diagnosis not present

## 2024-05-17 DIAGNOSIS — I2510 Atherosclerotic heart disease of native coronary artery without angina pectoris: Secondary | ICD-10-CM

## 2024-05-17 DIAGNOSIS — Z1211 Encounter for screening for malignant neoplasm of colon: Secondary | ICD-10-CM

## 2024-05-17 DIAGNOSIS — E1165 Type 2 diabetes mellitus with hyperglycemia: Secondary | ICD-10-CM

## 2024-05-17 DIAGNOSIS — Z299 Encounter for prophylactic measures, unspecified: Secondary | ICD-10-CM

## 2024-05-17 DIAGNOSIS — Z0000 Encounter for general adult medical examination without abnormal findings: Secondary | ICD-10-CM

## 2024-05-17 DIAGNOSIS — E785 Hyperlipidemia, unspecified: Secondary | ICD-10-CM

## 2024-05-17 DIAGNOSIS — Z7185 Encounter for immunization safety counseling: Secondary | ICD-10-CM

## 2024-05-17 DIAGNOSIS — E1169 Type 2 diabetes mellitus with other specified complication: Secondary | ICD-10-CM | POA: Diagnosis not present

## 2024-05-17 DIAGNOSIS — Z23 Encounter for immunization: Secondary | ICD-10-CM

## 2024-05-17 LAB — LIPID PANEL

## 2024-05-17 NOTE — Progress Notes (Signed)
 Subjective:   HPI  Dean Wallace is a 53 y.o. male who presents for Chief Complaint  Patient presents with   Annual Exam    Fasting cpe, no concerns. Due for cologuard, and would like Shingles, sees oscar ogelthrope for eye- requested this. No update in medical history    Patient Care Team: Paytyn Mesta, Christiane Cowing, PA-C as PCP - General (Family Medicine) Sees dentist Sees eye doctor  Concerns: Diabetes-compliant with metformin  1000mg  BID, ozempic  2mg  weekly.  Uses free style libre.  Lately sugars running a little high.  No polyuria, polydipsia, vision change.  Not much sweets, no sugary drinks.    Hyperlipidemia-compliant with crestor  40mg  and Aspirin  81mg  daily without complaint  Compliant with Truvada prep, no new partners, no symptoms of concern   Reviewed their medical, surgical, family, social, medication, and allergy history and updated chart as appropriate.  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   VATS surgery   Family history of premature CAD    Family history of prostate cancer    Hyperlipidemia    Myopia of both eyes 10/09/2017    Past Surgical History:  Procedure Laterality Date   DECORTICATION Right 03/12/2014   Procedure: DECORTICATION;  Surgeon: Zelphia Higashi, MD;  Location: [facility] OR;  Service: Thoracic;  Laterality: Right;   lung drained     VIDEO ASSISTED THORACOSCOPY (VATS)/EMPYEMA Right 03/12/2014   Procedure: VIDEO ASSISTED THORACOSCOPY (VATS)/EMPYEMA;  Surgeon: Zelphia Higashi, MD;  Location: [facility] OR;  Service: Thoracic;  Laterality: Right;      Family History  Problem Relation Age of Onset   COPD Mother    Heart disease Mother        CHF   Hypertension Mother    Cancer Mother        skin   Kidney disease Father    Cancer Father 27       prostate   Heart disease Father 12       CHF, MI    Hypertension Father     Arthritis Sister    Diabetes Brother    Cancer Maternal Grandmother        breast     Current Outpatient Medications:    aspirin  EC 81 MG tablet, Take 1 tablet (81 mg total) by mouth daily. Swallow whole., Disp: 100 tablet, Rfl: 3   Continuous Glucose Sensor (FREESTYLE LIBRE 3 SENSOR) MISC, USE 1 SENSOR EVERY 14 DAYS AS DIRECTED, Disp: 6 each, Rfl: 3   emtricitabine -tenofovir  (TRUVADA) 200-300 MG tablet, TAKE 1 TABLET BY MOUTH DAILY, Disp: 90 tablet, Rfl: 0   metFORMIN  (GLUCOPHAGE ) 1000 MG tablet, Take 1 tablet (1,000 mg total) by mouth 2 (two) times daily with a meal., Disp: 180 tablet, Rfl: 3   rosuvastatin  (CRESTOR ) 40 MG tablet, TAKE 1 TABLET BY MOUTH DAILY, Disp: 90 tablet, Rfl: 3   Semaglutide , 2 MG/DOSE, (OZEMPIC , 2 MG/DOSE,) 8 MG/3ML SOPN, INJECT SUBCUTANEOUSLY 2 MG EVERY WEEK, Disp: 9 mL, Rfl: 0  Allergies  Allergen Reactions   Kombiglyze Xr [Saxagliptin -Metformin  Er]     rash    Review of Systems  Constitutional:  Negative for chills, fever, malaise/fatigue and weight loss.  HENT:  Negative for congestion, ear pain, hearing loss, sore throat and tinnitus.   Eyes:  Negative for blurred vision, pain and redness.  Respiratory:  Negative for cough, hemoptysis and shortness of  breath.   Cardiovascular:  Negative for chest pain, palpitations, orthopnea, claudication and leg swelling.  Gastrointestinal:  Negative for abdominal pain, blood in stool, constipation, diarrhea, nausea and vomiting.  Genitourinary:  Negative for dysuria, flank pain, frequency, hematuria and urgency.  Musculoskeletal:  Negative for falls, joint pain and myalgias.  Skin:  Negative for itching and rash.  Neurological:  Negative for dizziness, tingling, speech change, weakness and headaches.  Endo/Heme/Allergies:  Negative for polydipsia. Does not bruise/bleed easily.  Psychiatric/Behavioral:  Negative for depression and memory loss. The patient is not nervous/anxious and does not have insomnia.          Objective:  BP 122/82   Pulse 91   Ht 5' 4.25" (1.632 m)   Wt 203 lb 9.6 oz (92.4 kg)   SpO2 98%   BMI 34.68 kg/m   General appearance: alert, no distress, WD/WN, Caucasian male Skin: no worrisome lesions Neck: supple, no lymphadenopathy, no thyromegaly, no masses, normal ROM, no bruits Chest: non tender, normal shape and expansion Heart: RRR, normal S1, S2, no murmurs Lungs: CTA bilaterally, no wheezes, rhonchi, or rales Abdomen: +bs, soft, non tender, non distended, no masses, no hepatomegaly, no splenomegaly, no bruits Back: non tender, normal ROM, no scoliosis Musculoskeletal: upper extremities non tender, no obvious deformity, normal ROM throughout, lower extremities non tender, no obvious deformity, normal ROM throughout Extremities: no edema, no cyanosis, no clubbing Pulses: 2+ symmetric, upper and lower extremities, normal cap refill Neurological: alert, oriented x 3, CN2-12 intact, strength normal upper extremities and lower extremities, sensation normal throughout, DTRs 2+ throughout, no cerebellar signs, gait normal Psychiatric: normal affect, behavior normal, pleasant  GU: normal male external genitalia,circumcised, nontender, no masses, no hernia, no lymphadenopathy Rectal: anus normal tone, prostate moderately enlarged, no nodules   Diabetic Foot Exam - Simple   Simple Foot Form Diabetic Foot exam was performed with the following findings: Yes 05/17/2024  9:01 AM  Visual Inspection See comments: Yes Sensation Testing Intact to touch and monofilament testing bilaterally: Yes Pulse Check Posterior Tibialis and Dorsalis pulse intact bilaterally: Yes Comments 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   Routine general medical examination at a health care facility Yes   Hyperlipidemia associated with type 2 diabetes mellitus (HCC)    Type 2 diabetes mellitus with  hyperglycemia, without long-term current use of insulin  (HCC)    Screening for prostate cancer    Vaccine counseling    Coronary artery disease involving native coronary artery of native heart without angina pectoris    Screen for STD (sexually transmitted disease)    Screen for colon cancer    Need for shingles vaccine    Prophylactic measure      This visit was a preventative care visit, also known as wellness visit or routine physical.   Topics typically include healthy lifestyle, diet, exercise, preventative care, vaccinations, sick and well care, proper use of emergency dept and after hours care, as well as other concerns.     Recommendations: Continue to return yearly for your annual wellness and preventative care visits.  This gives us  a chance to discuss healthy lifestyle, exercise, vaccinations, review your chart record, and perform screenings where appropriate.  I recommend you see your eye doctor yearly for routine vision care.  I recommend you see your dentist yearly for routine dental care including hygiene visits twice yearly.   Vaccination  recommendations were reviewed Immunization History  Administered Date(s) Administered   Hepb-cpg 09/07/2020, 10/09/2020   Influenza,inj,Quad PF,6+ Mos 02/02/2015, 09/07/2020, 10/23/2022   PFIZER(Purple Top)SARS-COV-2 Vaccination 03/24/2020, 04/18/2020, 10/22/2020   Pfizer(Comirnaty)Fall Seasonal Vaccine 12 years and older 10/23/2022   Pneumococcal Conjugate-13 06/29/2020   Pneumococcal Polysaccharide-23 02/02/2015   Tdap 02/02/2015   Zoster Recombinant(Shingrix) 05/17/2024    I recommend a yearly flu shot  Counseled on the Shingrix vaccine.  Vaccine information sheet given. Shingrix #1 vaccine given after consent obtained.   Return in 2 months for Shingrix #2.  Consider Prenvar 20   Screening for cancer: Colon cancer screening: Cologuard negative 2022.  Referral for updated Cologuard.  We discussed PSA, prostate exam,  and prostate cancer screening risks/benefits.    Skin cancer screening: Check your skin regularly for new changes, growing lesions, or other lesions of concern Come in for evaluation if you have skin lesions of concern.  Lung cancer screening: If you have a greater than 20 pack year history of tobacco use, then you may qualify for lung cancer screening with a chest CT scan.   Please call your insurance company to inquire about coverage for this test.  We currently don't have screenings for other cancers besides breast, cervical, colon, and lung cancers.  If you have a strong family history of cancer or have other cancer screening concerns, please let me know.    Bone health: Get at least 150 minutes of aerobic exercise weekly Get weight bearing exercise at least once weekly Bone density test:  A bone density test is an imaging test that uses a type of X-ray to measure the amount of calcium  and other minerals in your bones. The test may be used to diagnose or screen you for a condition that causes weak or thin bones (osteoporosis), predict your risk for a broken bone (fracture), or determine how well your osteoporosis treatment is working. The bone density test is recommended for females 65 and older, or females or males <65 if certain risk factors such as thyroid disease, long term use of steroids such as for asthma or rheumatological issues, vitamin D deficiency, estrogen deficiency, family history of osteoporosis, self or family history of fragility fracture in first degree relative.    Heart health: Get at least 150 minutes of aerobic exercise weekly Limit alcohol It is important to maintain a healthy blood pressure and healthy cholesterol numbers  Heart disease screening: Screening for heart disease includes screening for blood pressure, fasting lipids, glucose/diabetes screening, BMI height to weight ratio, reviewed of smoking status, physical activity, and diet.    Goals include  blood pressure 120/80 or less, maintaining a healthy lipid/cholesterol profile, preventing diabetes or keeping diabetes numbers under good control, not smoking or using tobacco products, exercising most days per week or at least 150 minutes per week of exercise, and eating healthy variety of fruits and vegetables, healthy oils, and avoiding unhealthy food choices like fried food, fast food, high sugar and high cholesterol foods.     CT coronary calcium  score 01/02/23: IMPRESSION: 1. Total calcium  score of 726 is at percentile 99 for subjects of the same age, gender, and race/ethnicity. 2. Aortic Atherosclerosis (ICD10-I70.0).    Medical care options: I recommend you continue to seek care here first for routine care.  We try really hard to have available appointments Monday through Friday daytime hours for sick visits, acute visits, and physicals.  Urgent care should be used for after hours and weekends for significant issues that  cannot wait till the next day.  The emergency department should be used for significant potentially life-threatening emergencies.  The emergency department is expensive, can often have long wait times for less significant concerns, so try to utilize primary care, urgent care, or telemedicine when possible to avoid unnecessary trips to the emergency department.  Virtual visits and telemedicine have been introduced since the pandemic started in 2020, and can be convenient ways to receive medical care.  We offer virtual appointments as well to assist you in a variety of options to seek medical care.    Separate significant issues discussed: Diabetes-updated labs today. Continue Ozempic  2mg  weekly, continue Metformin  1000mg  BID.  Counseled on diet, exercise, monitoring.    Hyperlipidemia-continue statin Crestor  40mg  and Aspirin  81mg  daily, labs today  Prep, prophylactic measure-updated STD screen today, continue Truvada    Gerritt was seen today for annual  exam.  Diagnoses and all orders for this visit:  Routine general medical examination at a health care facility -     Comprehensive metabolic panel with GFR -     CBC -     Lipid panel -     PSA -     Hemoglobin A1c -     Microalbumin/Creatinine Ratio, Urine -     Urinalysis, Routine w reflex microscopic -     HIV Antibody (routine testing w rflx)  Hyperlipidemia associated with type 2 diabetes mellitus (HCC) -     Lipid panel  Type 2 diabetes mellitus with hyperglycemia, without long-term current use of insulin  (HCC) -     Hemoglobin A1c -     Microalbumin/Creatinine Ratio, Urine  Screening for prostate cancer -     PSA  Vaccine counseling  Coronary artery disease involving native coronary artery of native heart without angina pectoris  Screen for STD (sexually transmitted disease) -     HIV Antibody (routine testing w rflx)  Screen for colon cancer -     Cologuard  Need for shingles vaccine -     Zoster Recombinant (Shingrix )  Prophylactic measure    Follow-up pending labs, yearly for physical

## 2024-05-18 ENCOUNTER — Other Ambulatory Visit: Payer: Self-pay | Admitting: Medical

## 2024-05-18 ENCOUNTER — Ambulatory Visit: Payer: Self-pay | Admitting: Medical

## 2024-05-18 LAB — CBC
Hematocrit: 49 % (ref 37.5–51.0)
Hemoglobin: 15.9 g/dL (ref 13.0–17.7)
MCH: 30.1 pg (ref 26.6–33.0)
MCHC: 32.4 g/dL (ref 31.5–35.7)
MCV: 93 fL (ref 79–97)
Platelets: 314 10*3/uL (ref 150–450)
RBC: 5.28 x10E6/uL (ref 4.14–5.80)
RDW: 12 % (ref 11.6–15.4)
WBC: 11.8 10*3/uL — ABNORMAL HIGH (ref 3.4–10.8)

## 2024-05-18 LAB — COMPREHENSIVE METABOLIC PANEL WITH GFR
ALT: 43 IU/L (ref 0–44)
AST: 15 IU/L (ref 0–40)
Albumin: 4.4 g/dL (ref 3.8–4.9)
Alkaline Phosphatase: 85 IU/L (ref 44–121)
BUN/Creatinine Ratio: 15 (ref 9–20)
BUN: 11 mg/dL (ref 6–24)
Bilirubin Total: 0.3 mg/dL (ref 0.0–1.2)
CO2: 18 mmol/L — ABNORMAL LOW (ref 20–29)
Calcium: 9.4 mg/dL (ref 8.7–10.2)
Chloride: 104 mmol/L (ref 96–106)
Creatinine, Ser: 0.73 mg/dL — ABNORMAL LOW (ref 0.76–1.27)
Globulin, Total: 2.3 g/dL (ref 1.5–4.5)
Glucose: 332 mg/dL — ABNORMAL HIGH (ref 70–99)
Potassium: 4.7 mmol/L (ref 3.5–5.2)
Sodium: 136 mmol/L (ref 134–144)
Total Protein: 6.7 g/dL (ref 6.0–8.5)
eGFR: 110 mL/min/{1.73_m2} (ref >59)

## 2024-05-18 LAB — LIPID PANEL
Cholesterol, Total: 138 mg/dL (ref 100–199)
HDL: 44 mg/dL (ref >39)
LDL CALC COMMENT:: 3.1 ratio (ref 0.0–5.0)
LDL Chol Calc (NIH): 79 mg/dL (ref 0–99)
Triglycerides: 79 mg/dL (ref 0–149)
VLDL Cholesterol Cal: 15 mg/dL (ref 5–40)

## 2024-05-18 LAB — URINALYSIS, ROUTINE W REFLEX MICROSCOPIC
Bilirubin, UA: NEGATIVE
Ketones, UA: NEGATIVE
Leukocytes,UA: NEGATIVE
Nitrite, UA: NEGATIVE
Protein,UA: NEGATIVE
RBC, UA: NEGATIVE
Specific Gravity, UA: >=1.03 — AB (ref 1.005–1.030)
Urobilinogen, Ur: 0.2 mg/dL (ref 0.2–1.0)
pH, UA: 6 (ref 5.0–7.5)

## 2024-05-18 LAB — MICROALBUMIN / CREATININE URINE RATIO
Creatinine, Urine: 77.8 mg/dL
Microalb/Creat Ratio: 7 mg/g{creat} (ref 0–29)
Microalbumin, Urine: 5.7 ug/mL

## 2024-05-18 LAB — HEMOGLOBIN A1C
Est. average glucose Bld gHb Est-mCnc: 283 mg/dL
Hgb A1c MFr Bld: 11.5 % — ABNORMAL HIGH (ref 4.8–5.6)

## 2024-05-18 LAB — PSA: Prostate Specific Ag, Serum: 0.7 ng/mL (ref 0.0–4.0)

## 2024-05-18 LAB — HIV ANTIBODY (ROUTINE TESTING W REFLEX)

## 2024-05-18 MED ORDER — METFORMIN HCL 1000 MG PO TABS: 1000.0000 mg | ORAL_TABLET | Freq: Two times a day (BID) | ORAL | 3 refills | Status: AC

## 2024-05-18 MED ORDER — OZEMPIC (2 MG/DOSE) 8 MG/3ML ~~LOC~~ SOPN: 2.0000 mg | PEN_INJECTOR | SUBCUTANEOUS | 2 refills | Status: AC

## 2024-05-18 MED ORDER — ROSUVASTATIN CALCIUM 40 MG PO TABS: 40.0000 mg | ORAL_TABLET | Freq: Every day | ORAL | 3 refills | Status: AC

## 2024-05-18 MED ORDER — INSULIN GLARGINE (2 UNIT DIAL) 300 UNIT/ML ~~LOC~~ SOPN: 8.0000 [IU] | PEN_INJECTOR | Freq: Every day | SUBCUTANEOUS | 2 refills | Status: DC

## 2024-05-18 MED ORDER — EMTRICITABINE-TENOFOVIR DF 200-300 MG PO TABS: 1.0000 | ORAL_TABLET | Freq: Every day | ORAL | 0 refills | Status: DC

## 2024-05-18 MED ORDER — FREESTYLE LIBRE 3 SENSOR MISC: 1.0000 | 3 refills | Status: AC

## 2024-05-18 NOTE — Progress Notes (Signed)
 Microalbumin kidney marker normal.  Continue other recommendations in the prior message

## 2024-05-18 NOTE — Progress Notes (Signed)
 Results sent through MyChart

## 2024-06-17 LAB — COLOGUARD: COLOGUARD: NEGATIVE

## 2024-06-19 NOTE — Progress Notes (Signed)
Results sent through mychart 

## 2024-07-18 ENCOUNTER — Other Ambulatory Visit (INDEPENDENT_AMBULATORY_CARE_PROVIDER_SITE_OTHER)

## 2024-07-18 DIAGNOSIS — E1165 Type 2 diabetes mellitus with hyperglycemia: Secondary | ICD-10-CM | POA: Diagnosis not present

## 2024-07-18 DIAGNOSIS — Z23 Encounter for immunization: Secondary | ICD-10-CM

## 2024-07-24 ENCOUNTER — Other Ambulatory Visit: Payer: Self-pay | Admitting: Medical

## 2024-08-09 ENCOUNTER — Other Ambulatory Visit: Payer: Self-pay | Admitting: Medical

## 2024-11-21 ENCOUNTER — Encounter: Payer: Self-pay | Admitting: Internal Medicine

## 2024-11-22 ENCOUNTER — Encounter: Payer: Self-pay | Admitting: Medical

## 2025-02-03 ENCOUNTER — Ambulatory Visit: Admitting: Medical
